# Patient Record
Sex: Male | Born: 1970 | Race: White | Hispanic: No | Marital: Married | Smoking: Never smoker
Health system: Southern US, Community
[De-identification: ages and names within clinical notes are randomized; demographics above are authoritative.]

## PROBLEM LIST (undated history)

## (undated) DIAGNOSIS — F32A Depression, unspecified: Secondary | ICD-10-CM

## (undated) DIAGNOSIS — E039 Hypothyroidism, unspecified: Secondary | ICD-10-CM

## (undated) DIAGNOSIS — R519 Headache, unspecified: Secondary | ICD-10-CM

## (undated) DIAGNOSIS — G4730 Sleep apnea, unspecified: Secondary | ICD-10-CM

## (undated) DIAGNOSIS — R51 Headache: Secondary | ICD-10-CM

## (undated) DIAGNOSIS — Z87442 Personal history of urinary calculi: Secondary | ICD-10-CM

## (undated) DIAGNOSIS — F419 Anxiety disorder, unspecified: Secondary | ICD-10-CM

## (undated) DIAGNOSIS — F329 Major depressive disorder, single episode, unspecified: Secondary | ICD-10-CM

## (undated) DIAGNOSIS — F909 Attention-deficit hyperactivity disorder, unspecified type: Secondary | ICD-10-CM

## (undated) HISTORY — PX: TONSILLECTOMY AND ADENOIDECTOMY: SHX28

## (undated) HISTORY — PX: TONSILLECTOMY: SUR1361

---

## 1998-08-18 ENCOUNTER — Encounter: Payer: Self-pay | Admitting: Otolaryngology

## 1998-08-18 ENCOUNTER — Ambulatory Visit (HOSPITAL_COMMUNITY): Admission: RE | Admit: 1998-08-18 | Discharge: 1998-08-18 | Payer: Self-pay | Admitting: Otolaryngology

## 2004-10-31 ENCOUNTER — Ambulatory Visit: Payer: Self-pay | Admitting: Family Medicine

## 2005-04-20 ENCOUNTER — Emergency Department: Payer: Self-pay | Admitting: Emergency Medicine

## 2006-06-01 DIAGNOSIS — F419 Anxiety disorder, unspecified: Secondary | ICD-10-CM | POA: Insufficient documentation

## 2007-08-09 ENCOUNTER — Ambulatory Visit: Payer: Self-pay | Admitting: Family Medicine

## 2008-07-31 DIAGNOSIS — J329 Chronic sinusitis, unspecified: Secondary | ICD-10-CM | POA: Insufficient documentation

## 2009-01-14 ENCOUNTER — Ambulatory Visit: Payer: Self-pay | Admitting: Family Medicine

## 2009-11-24 DIAGNOSIS — J309 Allergic rhinitis, unspecified: Secondary | ICD-10-CM | POA: Insufficient documentation

## 2010-11-10 ENCOUNTER — Ambulatory Visit: Payer: Self-pay

## 2011-02-24 ENCOUNTER — Emergency Department (INDEPENDENT_AMBULATORY_CARE_PROVIDER_SITE_OTHER): Payer: Managed Care, Other (non HMO)

## 2011-02-24 ENCOUNTER — Emergency Department (HOSPITAL_BASED_OUTPATIENT_CLINIC_OR_DEPARTMENT_OTHER)
Admission: EM | Admit: 2011-02-24 | Discharge: 2011-02-24 | Disposition: A | Discharge status: Home / Self Care | Payer: Managed Care, Other (non HMO) | Attending: Emergency Medicine | Admitting: Emergency Medicine

## 2011-02-24 DIAGNOSIS — N201 Calculus of ureter: Secondary | ICD-10-CM

## 2011-02-24 DIAGNOSIS — R109 Unspecified abdominal pain: Secondary | ICD-10-CM

## 2011-02-24 DIAGNOSIS — N200 Calculus of kidney: Secondary | ICD-10-CM | POA: Insufficient documentation

## 2011-02-24 LAB — URINALYSIS, ROUTINE W REFLEX MICROSCOPIC
Bilirubin Urine: NEGATIVE
Glucose, UA: NEGATIVE mg/dL
Ketones, ur: NEGATIVE mg/dL
Leukocytes, UA: NEGATIVE
Nitrite: NEGATIVE
Protein, ur: NEGATIVE mg/dL
Specific Gravity, Urine: 1.02 (ref 1.005–1.030)
Urobilinogen, UA: 0.2 mg/dL (ref 0.0–1.0)
pH: 8 (ref 5.0–8.0)

## 2011-02-24 LAB — BASIC METABOLIC PANEL
BUN: 13 mg/dL (ref 6–23)
CO2: 25 mEq/L (ref 19–32)
Calcium: 9.5 mg/dL (ref 8.4–10.5)
Chloride: 104 mEq/L (ref 96–112)
Creatinine, Ser: 1.1 mg/dL (ref 0.50–1.35)
GFR calc Af Amer: >60 mL/min (ref >60)
GFR calc non Af Amer: >60 mL/min (ref >60)
Glucose, Bld: 142 mg/dL — ABNORMAL HIGH (ref 70–99)
Potassium: 3.4 mEq/L — ABNORMAL LOW (ref 3.5–5.1)
Sodium: 139 mEq/L (ref 135–145)

## 2011-02-24 LAB — URINE MICROSCOPIC-ADD ON

## 2013-10-22 ENCOUNTER — Emergency Department: Payer: Self-pay | Admitting: Emergency Medicine

## 2013-10-27 ENCOUNTER — Other Ambulatory Visit: Payer: Self-pay | Admitting: Family Medicine

## 2013-10-27 DIAGNOSIS — R51 Headache: Secondary | ICD-10-CM

## 2013-10-28 ENCOUNTER — Ambulatory Visit
Admission: RE | Admit: 2013-10-28 | Discharge: 2013-10-28 | Disposition: A | Discharge status: Home / Self Care | Payer: BC Managed Care – PPO | Source: Ambulatory Visit | Attending: Family Medicine | Admitting: Family Medicine

## 2013-10-28 DIAGNOSIS — R51 Headache: Secondary | ICD-10-CM

## 2014-06-29 IMAGING — CT CT HEAD W/O CM
2 series · 16 of 30 positions shown, 20 images · non-contrast
Comparison: None.

CLINICAL DATA: Migraine headache.

EXAM:
CT HEAD WITHOUT CONTRAST
TECHNIQUE: Contiguous axial images were obtained from the base of the skull
through the vertex without intravenous contrast.

[Series 3: head bone · axial · 0.49mm/px · z∈[+23,+69]mm · 3 of 32 slices shown]
[im 3/32  bone]
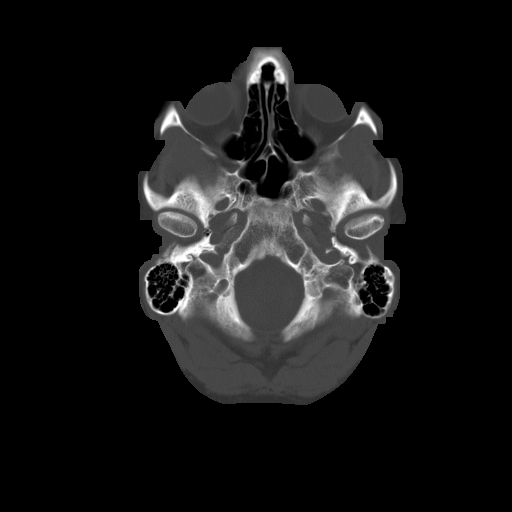
[im 7/32  bone]
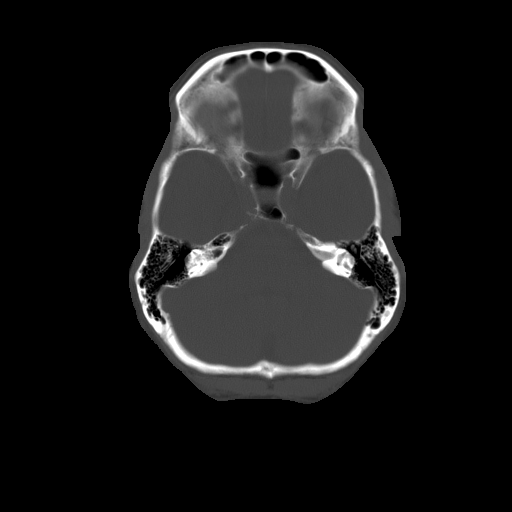
[im 12/32  bone]
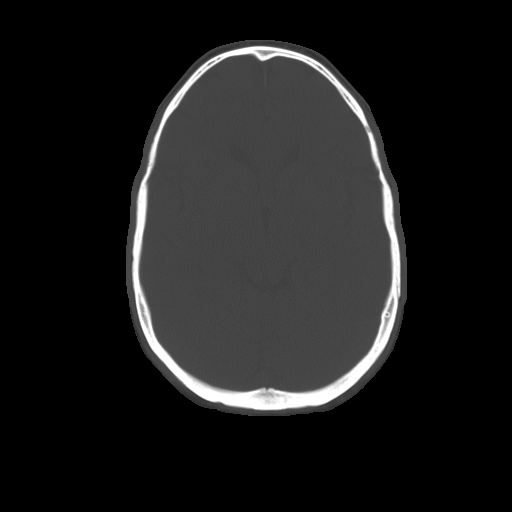

[Series 32: 3d filtered head w/o · axial · non-contrast · 0.49mm/px · z∈[+23,+156]mm · 13 of 32 slices shown, 17 images]
[im 3/32  brain]
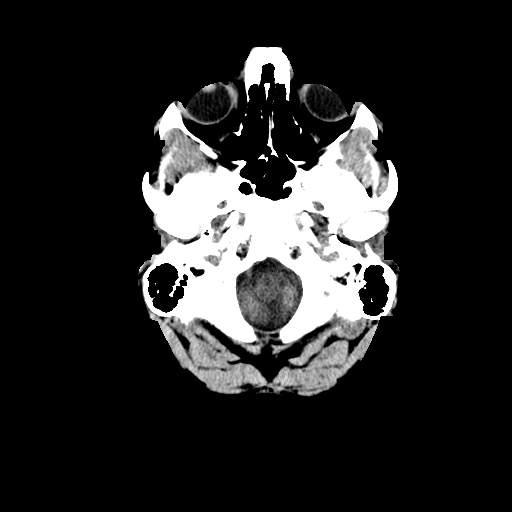
[im 3/32  bone]
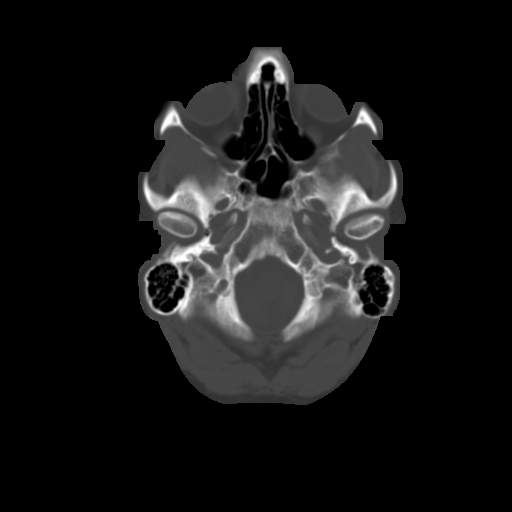
[im 5/32  brain]
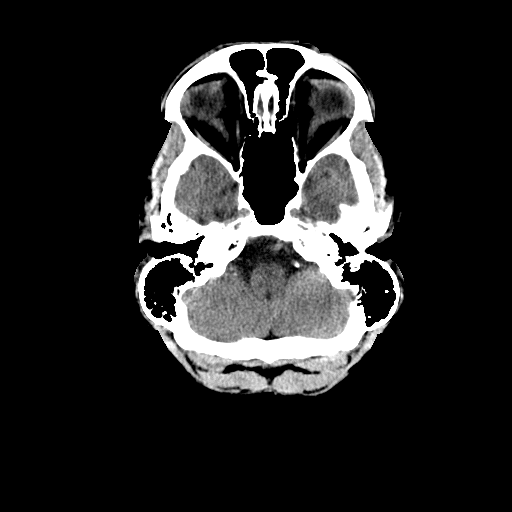
[im 7/32  brain]
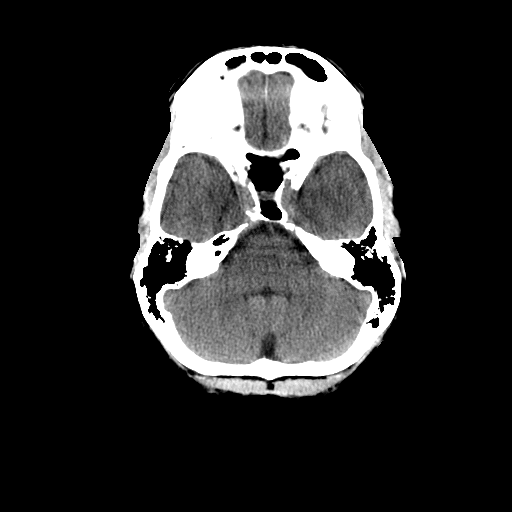
[im 9/32  brain]
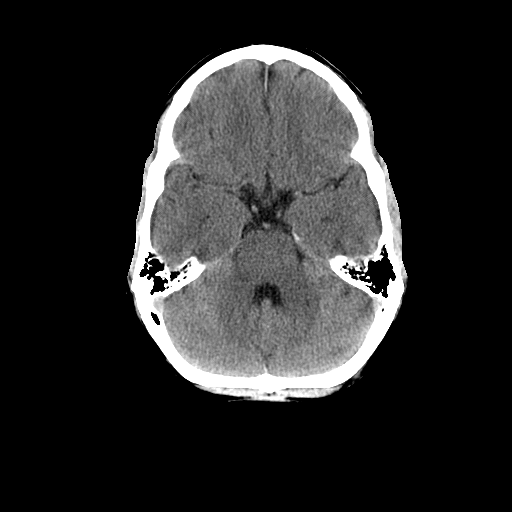
[im 12/32  brain]
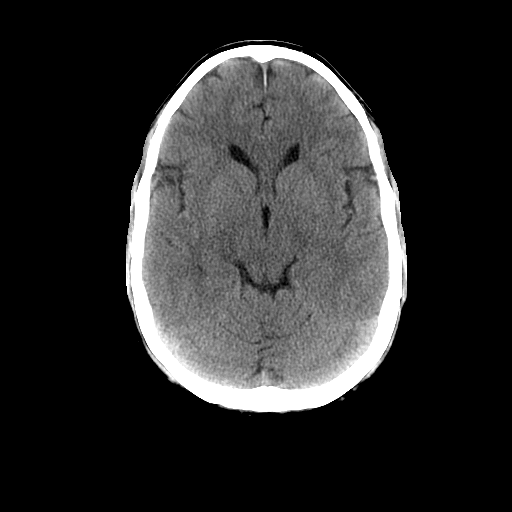
[im 12/32  bone]
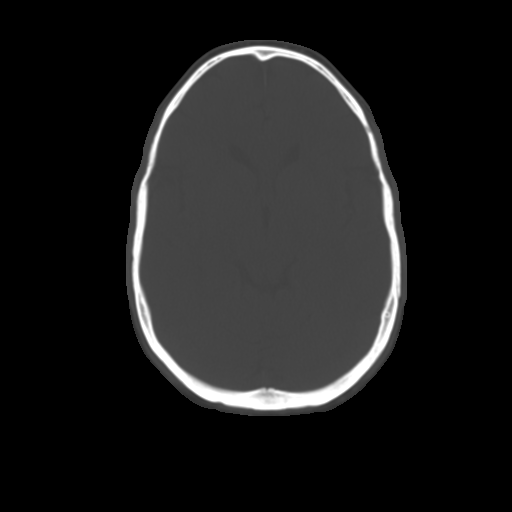
[im 14/32  brain]
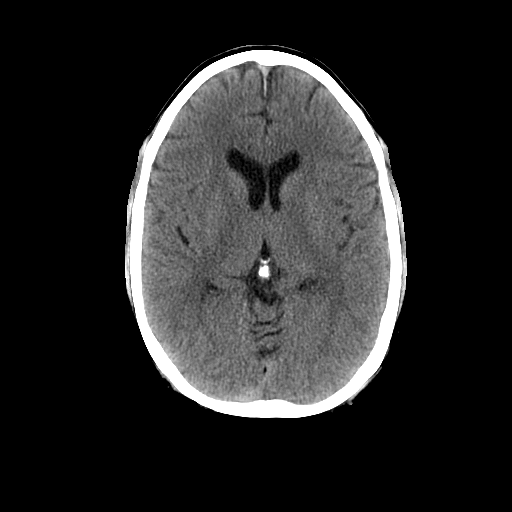
[im 16/32  brain]
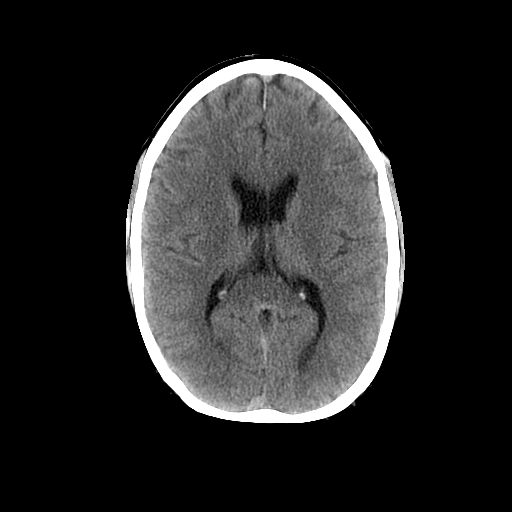
[im 18/32  brain]
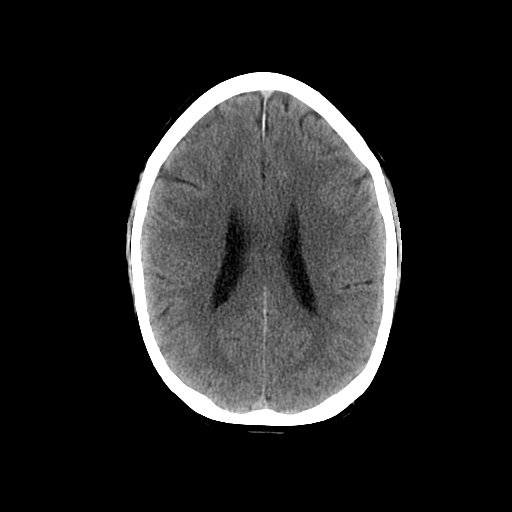
[im 20/32  brain]
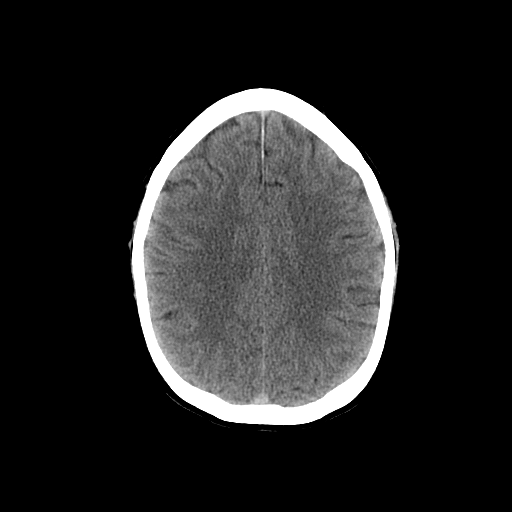
[im 20/32  bone]
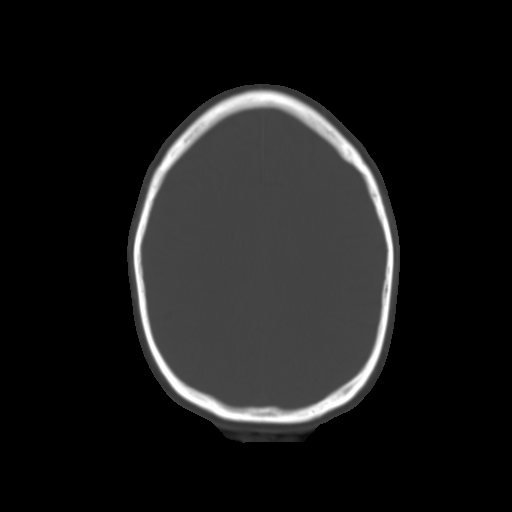
[im 23/32  brain]
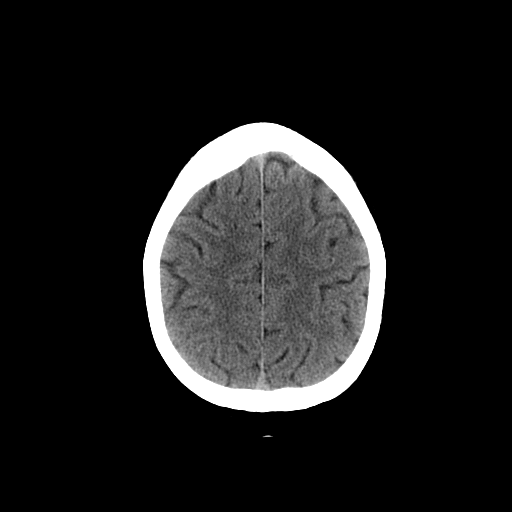
[im 25/32  brain]
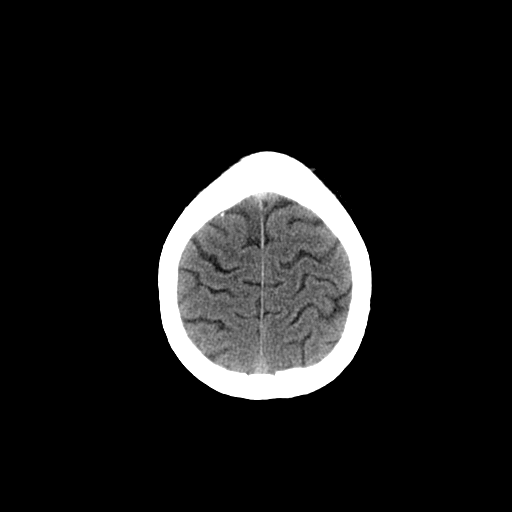
[im 27/32  brain]
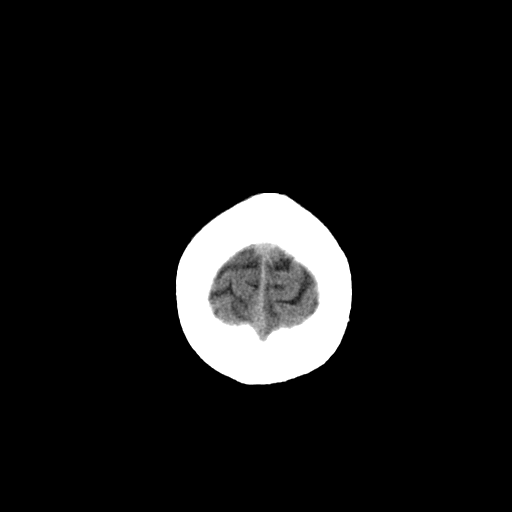
[im 29/32  brain]
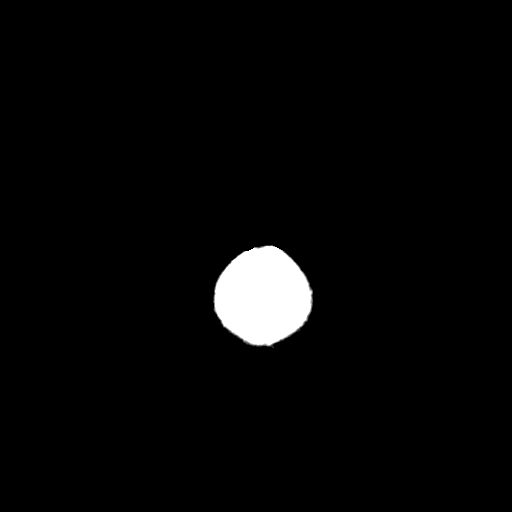
[im 29/32  bone]
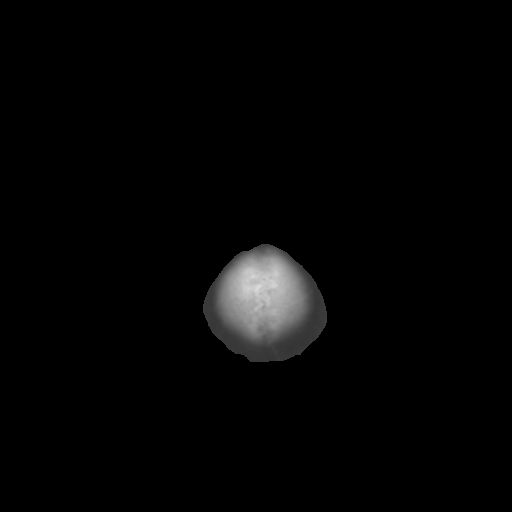

[16 of 30 positions shown; findings below may reference images not displayed]

FINDINGS: No mass lesion, mass effect, midline shift, hydrocephalus,
hemorrhage. No territorial ischemia or acute infarction. The
paranasal sinuses appear within normal limits.
IMPRESSION: Negative CT head.

## 2014-11-10 ENCOUNTER — Emergency Department (HOSPITAL_COMMUNITY): Payer: BLUE CROSS/BLUE SHIELD

## 2014-11-10 ENCOUNTER — Encounter (HOSPITAL_COMMUNITY): Payer: Self-pay

## 2014-11-10 ENCOUNTER — Emergency Department (HOSPITAL_COMMUNITY)
Admission: EM | Admit: 2014-11-10 | Discharge: 2014-11-10 | Disposition: A | Discharge status: Home / Self Care | Payer: BLUE CROSS/BLUE SHIELD | Attending: Emergency Medicine | Admitting: Emergency Medicine

## 2014-11-10 DIAGNOSIS — R42 Dizziness and giddiness: Secondary | ICD-10-CM | POA: Diagnosis not present

## 2014-11-10 DIAGNOSIS — R61 Generalized hyperhidrosis: Secondary | ICD-10-CM | POA: Diagnosis not present

## 2014-11-10 DIAGNOSIS — R002 Palpitations: Secondary | ICD-10-CM | POA: Diagnosis present

## 2014-11-10 DIAGNOSIS — R110 Nausea: Secondary | ICD-10-CM | POA: Insufficient documentation

## 2014-11-10 DIAGNOSIS — R55 Syncope and collapse: Secondary | ICD-10-CM | POA: Diagnosis not present

## 2014-11-10 HISTORY — DX: Anxiety disorder, unspecified: F41.9

## 2014-11-10 HISTORY — DX: Attention-deficit hyperactivity disorder, unspecified type: F90.9

## 2014-11-10 HISTORY — DX: Major depressive disorder, single episode, unspecified: F32.9

## 2014-11-10 HISTORY — DX: Depression, unspecified: F32.A

## 2014-11-10 LAB — I-STAT TROPONIN, ED: TROPONIN I, POC: 0 ng/mL (ref 0.00–0.08)

## 2014-11-10 LAB — CBC WITH DIFFERENTIAL/PLATELET
BASOS ABS: 0 10*3/uL (ref 0.0–0.1)
Basophils Relative: 0 % (ref 0–1)
EOS PCT: 0 % (ref 0–5)
Eosinophils Absolute: 0 10*3/uL (ref 0.0–0.7)
HCT: 42 % (ref 39.0–52.0)
Hemoglobin: 14.6 g/dL (ref 13.0–17.0)
LYMPHS PCT: 23 % (ref 12–46)
Lymphs Abs: 1.4 10*3/uL (ref 0.7–4.0)
MCH: 31.7 pg (ref 26.0–34.0)
MCHC: 34.8 g/dL (ref 30.0–36.0)
MCV: 91.3 fL (ref 78.0–100.0)
Monocytes Absolute: 0.4 10*3/uL (ref 0.1–1.0)
Monocytes Relative: 6 % (ref 3–12)
NEUTROS PCT: 71 % (ref 43–77)
Neutro Abs: 4.3 10*3/uL (ref 1.7–7.7)
PLATELETS: 225 10*3/uL (ref 150–400)
RBC: 4.6 MIL/uL (ref 4.22–5.81)
RDW: 11.9 % (ref 11.5–15.5)
WBC: 6.1 10*3/uL (ref 4.0–10.5)

## 2014-11-10 LAB — BASIC METABOLIC PANEL
Anion gap: 10 (ref 5–15)
BUN: 13 mg/dL (ref 6–23)
CO2: 28 mmol/L (ref 19–32)
Calcium: 9.8 mg/dL (ref 8.4–10.5)
Chloride: 102 mmol/L (ref 96–112)
Creatinine, Ser: 1.22 mg/dL (ref 0.50–1.35)
GFR calc Af Amer: 81 mL/min — ABNORMAL LOW (ref >90)
GFR calc non Af Amer: 70 mL/min — ABNORMAL LOW (ref >90)
GLUCOSE: 106 mg/dL — AB (ref 70–99)
POTASSIUM: 4 mmol/L (ref 3.5–5.1)
Sodium: 140 mmol/L (ref 135–145)

## 2014-11-10 LAB — MAGNESIUM: Magnesium: 2.3 mg/dL (ref 1.5–2.5)

## 2014-11-10 LAB — CBG MONITORING, ED: GLUCOSE-CAPILLARY: 104 mg/dL — AB (ref 70–99)

## 2014-11-10 NOTE — ED Notes (Signed)
Per [facility], pt from work for palpitations. Was sitting in a meeting and started feeling like his heart was racing. Denies having any chest pain. Pt states he felt lightheaded and clammy and nauseated. Denies any present. HR was going between 90-120 with EMS. 20g to LAC.

## 2014-11-10 NOTE — ED Notes (Signed)
CBG of 104 checked

## 2014-11-10 NOTE — ED Provider Notes (Signed)
CSN: [id number]     Arrival date & time 11/10/14  1606 History   First MD Initiated Contact with Patient 11/10/14 1611     Chief Complaint  Patient presents with  . Palpitations     (Consider location/radiation/quality/duration/timing/severity/associated sxs/prior Treatment) HPI  44 year old male with no significant past medical history. Comes in with a complaint of presyncope.  The patient says he was at work, in a meeting, when he started feeling kind of clammy and nauseous, hot, lightheaded. This went on for a period of 20 minutes, after which he came back to his baseline. He says it with the EMS was called and with EMS he was hypertensive above his baseline of 120/80.  Patient denies any chest pain/shortness of breath, neck or arm pain.   No past medical history on file. No past surgical history on file. No family history on file. History  Substance Use Topics  . Smoking status: Not on file  . Smokeless tobacco: Not on file  . Alcohol Use: Not on file    Review of Systems  Constitutional: Positive for diaphoresis. Negative for fever and chills.  Eyes: Negative for redness.  Respiratory: Negative for cough and shortness of breath.   Cardiovascular: Negative for chest pain.  Gastrointestinal: Positive for nausea. Negative for vomiting, abdominal pain and diarrhea.  Genitourinary: Negative for dysuria.  Skin: Negative for rash.  Neurological: Positive for light-headedness. Negative for headaches.  All other systems reviewed and are negative.     Allergies  Review of patient's allergies indicates not on file.  Home Medications   Prior to Admission medications   Not on File   There were no vitals taken for this visit. Physical Exam  Constitutional: He is oriented to person, place, and time. No distress.  HENT:  Head: Normocephalic and atraumatic.  Eyes: EOM are normal. Pupils are equal, round, and reactive to light.  Neck: Normal range of motion. Neck supple.   Cardiovascular: Normal rate.   No murmur heard. Pulmonary/Chest: Effort normal. No respiratory distress.  Abdominal: Soft. There is no tenderness.  Musculoskeletal: Normal range of motion.  Neurological: He is alert and oriented to person, place, and time.  Skin: No rash noted. He is not diaphoretic.  Psychiatric: He has a normal mood and affect.   Cranial Nerves  II:  Visual fields Intact, pupillary equal round and reactive to light III, IV, VI: Full eye movement without nystagmus  V Facial Sensation: Normal. No weakness of masticatory muscles  VII: No facial weakness or asymmetry  VIII Auditory Acuity: Grossly normal  IX/X: The uvula is midline; the palate elevates symmetrically  XI: Normal sternocleidomastoid and trapezius strength  XII: The tongue is midline. No atrophy or fasciculations.  Motor System: Muscle Strength: 5/5 and symmetric in the upper and lower extremities. No pronation or drift.  Muscle Tone: Tone and muscle bulk are normal in the upper and lower extremities.  Reflexes: DTRs: 2+ and symmetrical in all four extremities.  Coordination: Intact finger-to-nose, heel-to-shin, and rapid alternating movements. No tremor.  Sensation: Intact to light touch. Negative Romberg test.  Gait: Routine and tandem gait are normal    ED Course  Procedures (including critical care time) Labs Review Labs Reviewed  CBC WITH DIFFERENTIAL/PLATELET  BASIC METABOLIC PANEL  MAGNESIUM  CBG MONITORING, ED    Imaging Review No results found.   EKG Interpretation None      MDM   Final diagnoses:  None    44 year old male with no significant past  medical history. Comes in with a complaint of presyncope.  Exam as above, well appearing, normal vitals.  EKG w/o long QT, WPW, brugada. Cbc/bmp unremarkable.  Given no hx of cardiac disease, no true syncope and prodromal sx.  Feel likely vasovagal pre-syncope.  Feel safe for d/c w/ outpatient f/u.  I have discussed the  results, Dx and Tx plan with the patient. They expressed understanding and agree with the plan and were told to return to ED with any worsening of condition or concern.    Disposition: Discharge  Condition: Good  There are no discharge medications for this patient.   Follow Up: [facility] EMERGENCY DEPARTMENT [address] [id number] mc [place] [address] [phone]  If symptoms worsen   Pt seen in conjunction with Dr. Christ Kick, MD 11/11/14 0020  Mirian Mo, MD 11/16/14 [phone]

## 2014-11-10 NOTE — Discharge Instructions (Signed)
Near-Syncope Near-syncope (commonly known as near fainting) is sudden weakness, dizziness, or feeling like you might pass out. During an episode of near-syncope, you may also develop pale skin, have tunnel vision, or feel sick to your stomach (nauseous). Near-syncope may occur when getting up after sitting or while standing for a long time. It is caused by a sudden decrease in blood flow to the brain. This decrease can result from various causes or triggers, most of which are not serious. However, because near-syncope can sometimes be a sign of something serious, a medical evaluation is required. The specific cause is often not determined. HOME CARE INSTRUCTIONS  Monitor your condition for any changes. The following actions may help to alleviate any discomfort you are experiencing:  Have someone stay with you until you feel stable.  Lie down right away and prop your feet up if you start feeling like you might faint. Breathe deeply and steadily. Wait until all the symptoms have passed. Most of these episodes last only a few minutes. You may feel tired for several hours.   Drink enough fluids to keep your urine clear or pale yellow.   If you are taking blood pressure or heart medicine, get up slowly when seated or lying down. Take several minutes to sit and then stand. This can reduce dizziness.  Follow up with your health care provider as directed. SEEK IMMEDIATE MEDICAL CARE IF:   You have a severe headache.   You have unusual pain in the chest, abdomen, or back.   You are bleeding from the mouth or rectum, or you have black or tarry stool.   You have an irregular or very fast heartbeat.   You have repeated fainting or have seizure-like jerking during an episode.   You faint when sitting or lying down.   You have confusion.   You have difficulty walking.   You have severe weakness.   You have vision problems.  MAKE SURE YOU:   Understand these instructions.  Will  watch your condition.  Will get help right away if you are not doing well or get worse. Document Released: 08/14/2005 Document Revised: 08/19/2013 Document Reviewed: 01/17/2013 ExitCare Patient Information 2015 [employer]. This information is not intended to replace advice given to you by your health care provider. Make sure you discuss any questions you have with your health care provider.  

## 2014-11-10 NOTE — ED Notes (Signed)
Resident at the bedside

## 2014-11-16 LAB — LIPID PANEL
Cholesterol: 233 mg/dL — AB (ref 0–200)
HDL: 46 mg/dL (ref 35–70)
LDL Cholesterol: 129 mg/dL
Triglycerides: 288 mg/dL — AB (ref 40–160)

## 2014-11-19 DIAGNOSIS — R519 Headache, unspecified: Secondary | ICD-10-CM | POA: Insufficient documentation

## 2014-11-19 DIAGNOSIS — M25519 Pain in unspecified shoulder: Secondary | ICD-10-CM | POA: Insufficient documentation

## 2014-11-19 DIAGNOSIS — R6889 Other general symptoms and signs: Secondary | ICD-10-CM | POA: Insufficient documentation

## 2014-11-19 DIAGNOSIS — R002 Palpitations: Secondary | ICD-10-CM | POA: Insufficient documentation

## 2014-11-19 DIAGNOSIS — R069 Unspecified abnormalities of breathing: Secondary | ICD-10-CM | POA: Insufficient documentation

## 2014-11-19 DIAGNOSIS — F329 Major depressive disorder, single episode, unspecified: Secondary | ICD-10-CM | POA: Insufficient documentation

## 2014-11-19 DIAGNOSIS — G43109 Migraine with aura, not intractable, without status migrainosus: Secondary | ICD-10-CM | POA: Insufficient documentation

## 2014-11-19 DIAGNOSIS — R51 Headache: Secondary | ICD-10-CM

## 2015-02-02 ENCOUNTER — Ambulatory Visit (INDEPENDENT_AMBULATORY_CARE_PROVIDER_SITE_OTHER): Payer: BLUE CROSS/BLUE SHIELD | Admitting: Family Medicine

## 2015-02-02 ENCOUNTER — Encounter: Payer: Self-pay | Admitting: Family Medicine

## 2015-02-02 VITALS — BP 104/78 | HR 68 | Temp 98.0°F | Resp 16 | Ht 68.0 in | Wt 188.0 lb

## 2015-02-02 DIAGNOSIS — Z0289 Encounter for other administrative examinations: Secondary | ICD-10-CM

## 2015-02-02 NOTE — Progress Notes (Signed)
Patient ID: Caleb Miles, male   DOB: 07-25-1971, 44 y.o.   MRN: [id number] Chief Complaint  Patient presents with  . Letter for School/Work    Patient comes in office today top discuss FMLA leave, he states that he has missed a lot of time from work due to personal related reasons ( migranes, stress/anxiety) and needs forms filled out for work.  Has underwent negative cardiac w/u for palpitations and continues to see psychiatry (Dr. Evelene CroonKaur) for Depression, anxiety, and ADD. Treat migraine headaches with tylenol and trazodone to help sleep. Estimates 20/year which usually will knock him out of work for 1.5 days. Wishes FMLA to cover for these periodic absences. FMLA completed

## 2015-02-02 NOTE — Patient Instructions (Signed)
Will amend FMLA as necessary

## 2015-06-28 ENCOUNTER — Ambulatory Visit: Payer: BLUE CROSS/BLUE SHIELD | Admitting: Family Medicine

## 2015-06-28 DIAGNOSIS — Z87898 Personal history of other specified conditions: Secondary | ICD-10-CM | POA: Insufficient documentation

## 2015-06-28 DIAGNOSIS — M25519 Pain in unspecified shoulder: Secondary | ICD-10-CM | POA: Insufficient documentation

## 2015-06-28 DIAGNOSIS — G43109 Migraine with aura, not intractable, without status migrainosus: Secondary | ICD-10-CM | POA: Insufficient documentation

## 2015-06-28 DIAGNOSIS — R0681 Apnea, not elsewhere classified: Secondary | ICD-10-CM | POA: Insufficient documentation

## 2015-06-28 DIAGNOSIS — R0602 Shortness of breath: Secondary | ICD-10-CM | POA: Insufficient documentation

## 2015-07-12 IMAGING — CR DG CHEST 2V
2 series · 2 of 2 positions shown · non-contrast
Comparison: None.

CLINICAL DATA: The patient was sitting in a meeting this morning
and started feeling lightheaded and dizzy. Numbness/ tingling left
arm. No recent chest complaints.

EXAM:
CHEST  2 VIEW

[chest pa]
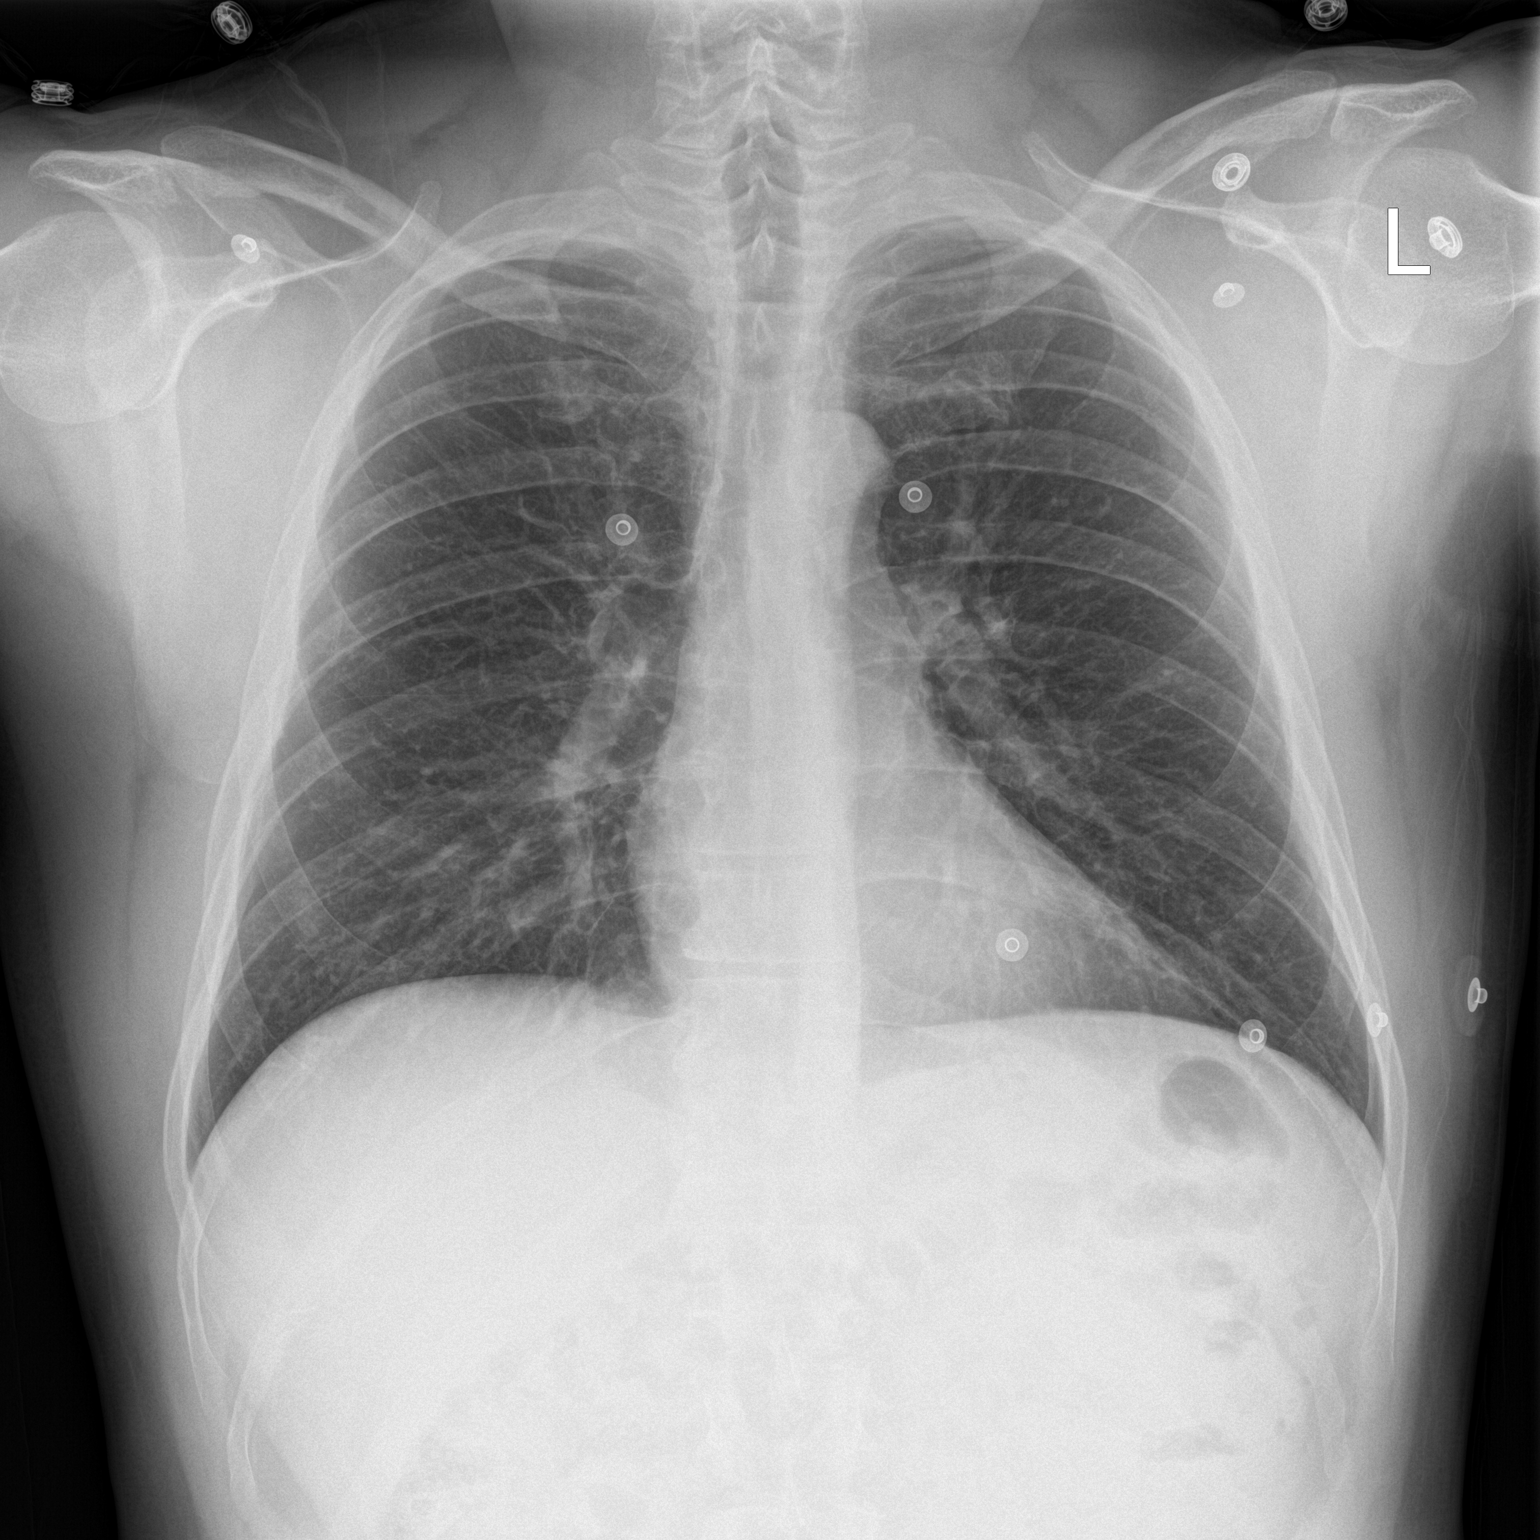

[chest lat]
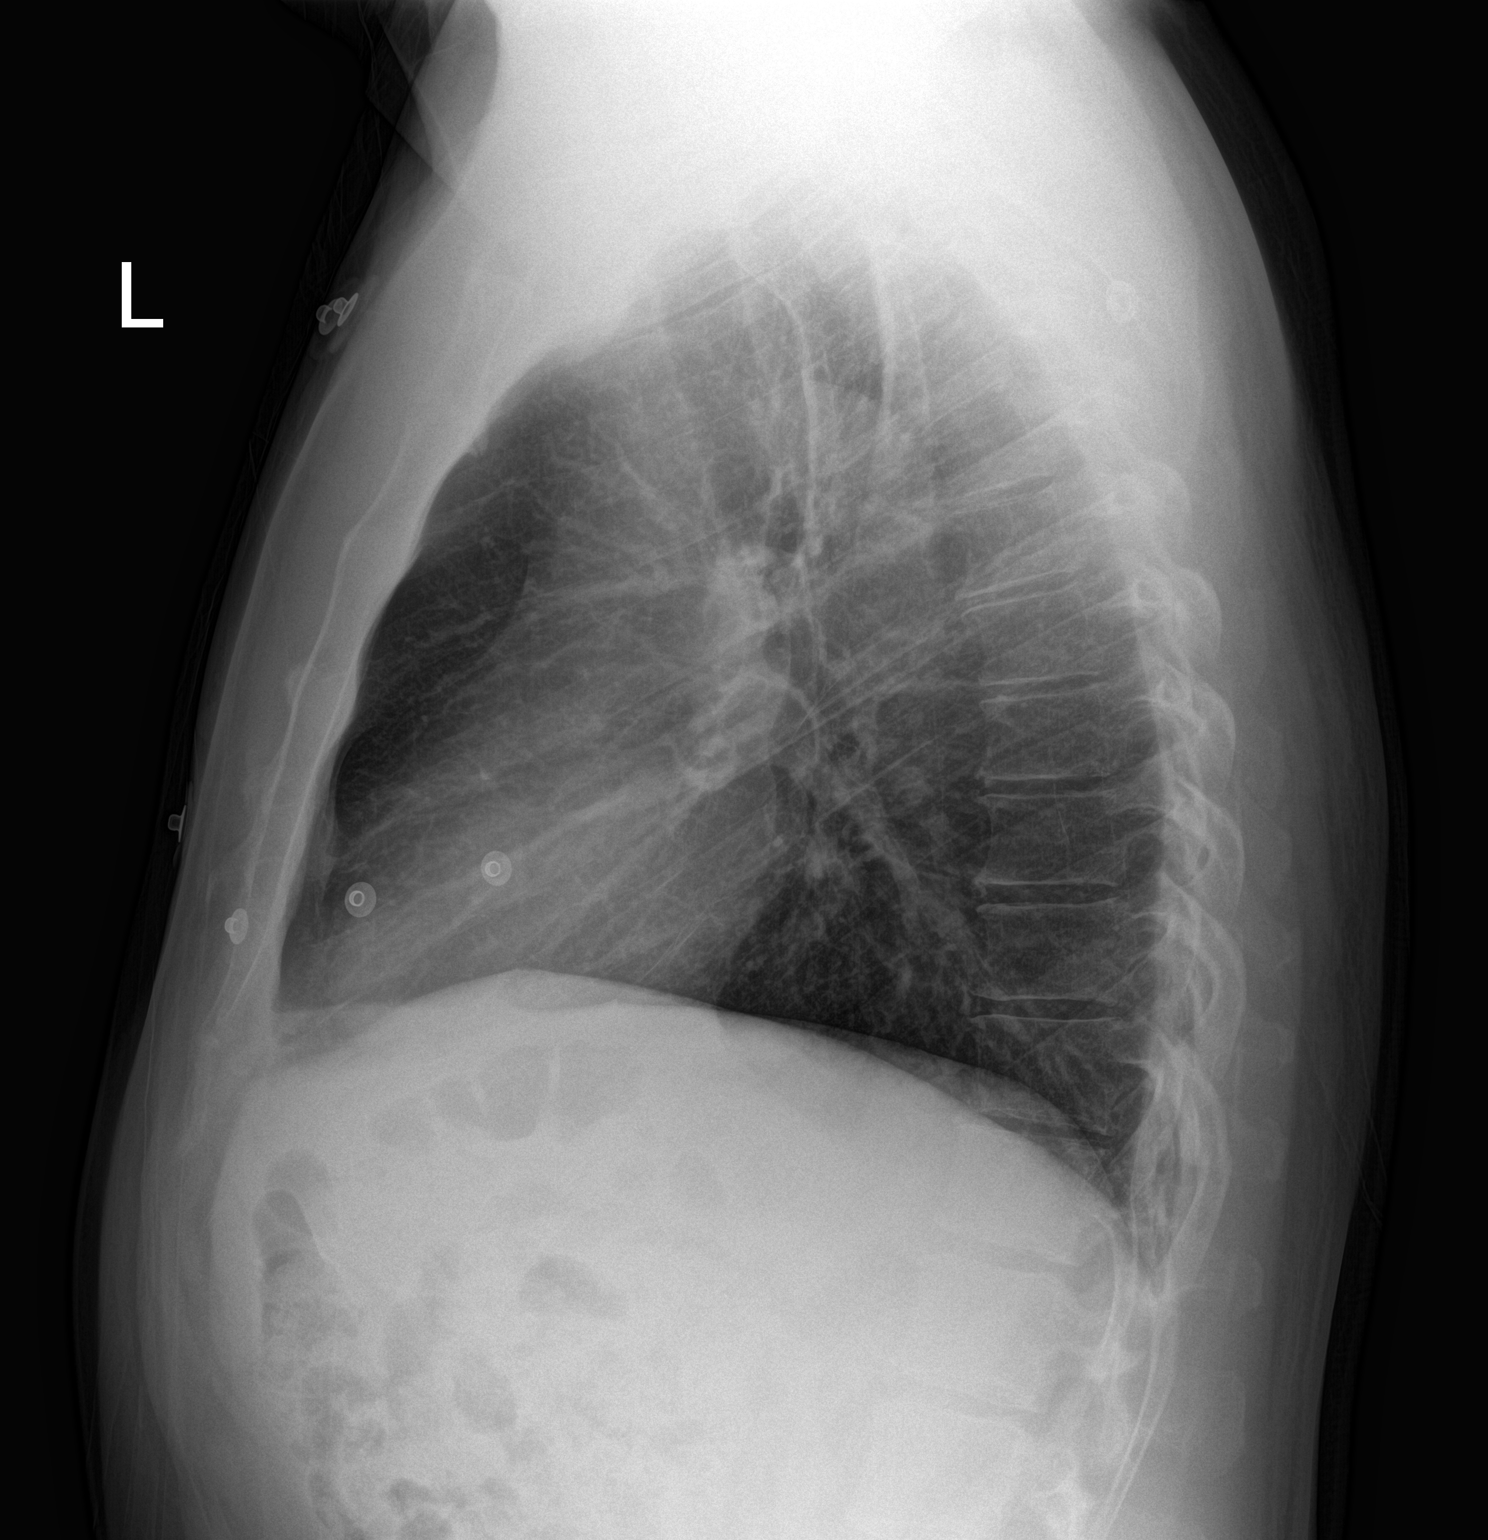

[2 of 2 positions shown; findings below may reference images not displayed]

FINDINGS: The heart size and mediastinal contours are within normal limits.
Both lungs are clear. The visualized skeletal structures are
unremarkable.
IMPRESSION: No active cardiopulmonary disease.

## 2015-09-20 ENCOUNTER — Encounter: Payer: Self-pay | Admitting: Family Medicine

## 2015-09-20 ENCOUNTER — Ambulatory Visit (INDEPENDENT_AMBULATORY_CARE_PROVIDER_SITE_OTHER): Payer: BLUE CROSS/BLUE SHIELD | Admitting: Family Medicine

## 2015-09-20 VITALS — BP 110/78 | HR 64 | Temp 97.8°F | Resp 16 | Wt 190.2 lb

## 2015-09-20 DIAGNOSIS — J069 Acute upper respiratory infection, unspecified: Secondary | ICD-10-CM

## 2015-09-20 DIAGNOSIS — F988 Other specified behavioral and emotional disorders with onset usually occurring in childhood and adolescence: Secondary | ICD-10-CM | POA: Insufficient documentation

## 2015-09-20 MED ORDER — AMOXICILLIN-POT CLAVULANATE 875-125 MG PO TABS: 1.0000 | ORAL_TABLET | Freq: Two times a day (BID) | ORAL | Status: DC

## 2015-09-20 NOTE — Progress Notes (Signed)
Subjective:     Patient ID: Caleb Miles, male   DOB: Aug 12, 1971, 45 y.o.   MRN: [id number]  HPI  Chief Complaint  Patient presents with  . Sinus Problem    Patient comes in office today with concerns of sinus pain and congestion for the past week. Patient states that pressure is behind his eyes and around his nasal area, he reports pressure in ears but denies any other associated symptoms. Patient has taken otc MUcinex D and Tylenol for relief  States this started with a sore throat then development of purulent sinus drainage. Notes improvement with Mucinex D.   Review of Systems  Constitutional: Negative for fever and chills.       Objective:   Physical Exam  Constitutional: He appears well-developed and well-nourished. No distress.  Ears: T.M's intact without inflammation Sinuses: mild maxillary sinus tenderness Throat: tonsils absent Neck: no cervical adenopathy Lungs: clear     Assessment:    1. Upper respiratory infectio - amoxicillin-clavulanate (AUGMENTIN) 875-125 MG tablet; Take 1 tablet by mouth 2 (two) times daily.  Dispense: 20 tablet; Refill: 0    Plan:    Continue Mucinex D. Start antibiotic if sinuses not continuing to improve over the next 2-3 days.

## 2015-09-20 NOTE — Patient Instructions (Addendum)
Continue Mucinex D and Delsym for cough. Start antibiotic if sinuses not continuing to clear in the next 2-3 days.

## 2015-12-01 ENCOUNTER — Encounter: Payer: Self-pay | Admitting: Family Medicine

## 2015-12-01 ENCOUNTER — Ambulatory Visit (INDEPENDENT_AMBULATORY_CARE_PROVIDER_SITE_OTHER): Payer: BLUE CROSS/BLUE SHIELD | Admitting: Family Medicine

## 2015-12-01 VITALS — BP 104/80 | HR 72 | Temp 99.0°F | Resp 16 | Wt 197.2 lb

## 2015-12-01 DIAGNOSIS — L259 Unspecified contact dermatitis, unspecified cause: Secondary | ICD-10-CM | POA: Diagnosis not present

## 2015-12-01 MED ORDER — FLUOCINONIDE 0.05 % EX CREA: 1.0000 "application " | TOPICAL_CREAM | Freq: Three times a day (TID) | CUTANEOUS | Status: DC

## 2015-12-01 MED ORDER — PREDNISONE 20 MG PO TABS: ORAL_TABLET | ORAL | Status: DC

## 2015-12-01 NOTE — Progress Notes (Signed)
Subjective:     Patient ID: Caleb Miles, male   DOB: 01-Feb-1971, 45 y.o.   MRN: [id number]  HPI  Chief Complaint  Patient presents with  . Rash    Patient comes in office today with concerns of rash on both arms that presented itself on 11/28/15. Patient recalls doing yardwork and pulling up vines, he has applied Calomine lotion to areas on his arm.   Has been taking Claritin for allergies.   Review of Systems     Objective:   Physical Exam  Constitutional: He appears well-developed and well-nourished. No distress.  Papular/vesicular rash on his forearms in both a discrete/linear pattern.      Assessment:    1. Contact dermatitis - fluocinonide cream (LIDEX) 0.05 %; Apply 1 application topically 3 (three) times daily. To poison ivy rash  Dispense: 30 g; Refill: 0 - predniSONE (DELTASONE) 20 MG tablet; Taper as follows: 3 pills for 4 days, two pills for 4 days, one pill for four days  Dispense: 24 tablet; Refill: 0    Plan:    Will start prednisone if not improving with topical steroids or develops facial involvement.

## 2015-12-01 NOTE — Patient Instructions (Signed)
Discussed use of oral Benadryl for itching at night.

## 2016-05-02 ENCOUNTER — Ambulatory Visit (INDEPENDENT_AMBULATORY_CARE_PROVIDER_SITE_OTHER): Payer: BLUE CROSS/BLUE SHIELD | Admitting: Family Medicine

## 2016-05-02 ENCOUNTER — Encounter: Payer: Self-pay | Admitting: Family Medicine

## 2016-05-02 VITALS — BP 110/80 | HR 84 | Temp 98.4°F | Resp 15 | Wt 195.4 lb

## 2016-05-02 DIAGNOSIS — J0100 Acute maxillary sinusitis, unspecified: Secondary | ICD-10-CM

## 2016-05-02 MED ORDER — AMOXICILLIN-POT CLAVULANATE 875-125 MG PO TABS: 1.0000 | ORAL_TABLET | Freq: Two times a day (BID) | ORAL | 0 refills | Status: DC

## 2016-05-02 MED ORDER — HYDROCODONE-HOMATROPINE 5-1.5 MG/5ML PO SYRP: ORAL_SOLUTION | ORAL | 0 refills | Status: DC

## 2016-05-02 NOTE — Progress Notes (Signed)
Subjective:     Patient ID: Caleb Miles, male   DOB: 1971/06/08, [age] y.o.   MRN: [id number]  HPI  Chief Complaint  Patient presents with  . Cough    Patient comes into office today with concerns of cough and congestion. Patient staets that he has been ill for over a week with cough productive of yellow like phlegm, sinus pressure and post nasal drip. Patient states that he has taken otc Tylenol and Mucinex D.   Patient reports increased sinus pressure, purulent sinus drainage, post nasal drainage and accompanying cough   Review of Systems     Objective:   Physical Exam  Constitutional: He appears well-developed and well-nourished. No distress.  Ears: T.M's intact without inflammation Sinuses: moderate maxillary sinus tenderness Throat: tonsils absent Neck: no cervical adenopathy Lungs: clear     Assessment:    1. Acute maxillary sinusitis, recurrence not specified - amoxicillin-clavulanate (AUGMENTIN) 875-125 MG tablet; Take 1 tablet by mouth 2 (two) times daily.  Dispense: 20 tablet; Refill: 0 - HYDROcodone-homatropine (HYCODAN) 5-1.5 MG/5ML syrup; 5 ml 4-6 hours as needed for cough  Dispense: 240 mL; Refill: 0    Plan:    Continue Mucinex D

## 2016-05-02 NOTE — Patient Instructions (Signed)
Continue Mucinex D. 

## 2016-10-06 DIAGNOSIS — F331 Major depressive disorder, recurrent, moderate: Secondary | ICD-10-CM | POA: Diagnosis not present

## 2016-10-06 DIAGNOSIS — G4709 Other insomnia: Secondary | ICD-10-CM | POA: Diagnosis not present

## 2016-10-16 ENCOUNTER — Encounter: Payer: Self-pay | Admitting: Family Medicine

## 2016-10-16 ENCOUNTER — Ambulatory Visit (INDEPENDENT_AMBULATORY_CARE_PROVIDER_SITE_OTHER): Payer: BLUE CROSS/BLUE SHIELD | Admitting: Family Medicine

## 2016-10-16 VITALS — BP 112/80 | HR 76 | Temp 99.0°F | Resp 16 | Wt 191.4 lb

## 2016-10-16 DIAGNOSIS — J0100 Acute maxillary sinusitis, unspecified: Secondary | ICD-10-CM

## 2016-10-16 MED ORDER — AMOXICILLIN-POT CLAVULANATE 875-125 MG PO TABS: 1.0000 | ORAL_TABLET | Freq: Two times a day (BID) | ORAL | 0 refills | Status: DC

## 2016-10-16 NOTE — Patient Instructions (Signed)
Continue Mucinex D. Let me know if sinuses not improving.

## 2016-10-16 NOTE — Progress Notes (Signed)
Subjective:     Patient ID: Caleb Miles, male   DOB: July 17, 1971, 46 y.o.   MRN: [id number]  HPI  Chief Complaint  Patient presents with  . Sinus Problem    Patient comes in office today with complaints of sinus pain and pressure over his entire head for the past week and half. Associated with sinus pain patient complains of sore throat, ear pain and post nasal drip. Patient has been taking otc Mucinex D and Benadryl for relief.   Patient reports increased sinus pressure, purulent sinus drainage, post nasal drainage and accompanying cough   Review of Systems     Objective:   Physical Exam  Constitutional: He appears well-developed and well-nourished. No distress.  Ears: T.M's intact without inflammation Sinuses: mild maxillary sinus tenderness Throat: tonsils absent Neck: no cervical adenopathy Lungs: clear     Assessment:    1. Acute maxillary sinusitis, recurrence not specified - amoxicillin-clavulanate (AUGMENTIN) 875-125 MG tablet; Take 1 tablet by mouth 2 (two) times daily.  Dispense: 20 tablet; Refill: 0    Plan:    Continue Mucinex D. Work excuse for 2/15-2/16/18.

## 2016-12-01 DIAGNOSIS — F331 Major depressive disorder, recurrent, moderate: Secondary | ICD-10-CM | POA: Diagnosis not present

## 2016-12-12 ENCOUNTER — Encounter: Payer: Self-pay | Admitting: Family Medicine

## 2016-12-12 ENCOUNTER — Ambulatory Visit (INDEPENDENT_AMBULATORY_CARE_PROVIDER_SITE_OTHER): Payer: BLUE CROSS/BLUE SHIELD | Admitting: Family Medicine

## 2016-12-12 VITALS — BP 120/90 | HR 66 | Temp 98.2°F | Resp 16 | Wt 198.0 lb

## 2016-12-12 DIAGNOSIS — M722 Plantar fascial fibromatosis: Secondary | ICD-10-CM

## 2016-12-12 NOTE — Progress Notes (Signed)
Subjective:     Patient ID: Caleb Miles, male   DOB: 1971/02/15, 46 y.o.   MRN: [id number]  HPI  Chief Complaint  Patient presents with  . Foot Pain    Patient comes in office today with concerns of pain in his left heel for the past 2 months or more, patient states that pain has gradually been getting worse. Patient denies any injury to foot or prolonged standing/walking or exertion. Patient describes pain as sharp and achy, patient reports pain when walking or applying pressure to foot.   States he has put gel pads in his work boots and has taken ibuprofen with little improvement. Denies specific injury. 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   Review of Systems     Objective:   Physical Exam  Constitutional: He appears well-developed and well-nourished. No distress.  Cardiovascular:  Pulses:      Dorsalis pedis pulses are 2+ on the left side.       Posterior tibial pulses are 2+ on the left side.  Musculoskeletal: He exhibits no edema.  Left ankle DF/PF 5/5. Mild tenderness over plantar heel. Ankle ligaments stable       Assessment:    1. Plantar fasciitis of left foot - Ambulatory referral to Podiatry    Plan:    Continue current gel inserts pending podiatry referral.

## 2016-12-12 NOTE — Patient Instructions (Signed)
We will call you with the podiatry appointment time.

## 2016-12-22 ENCOUNTER — Encounter: Payer: Self-pay | Admitting: Podiatry

## 2016-12-22 ENCOUNTER — Ambulatory Visit (INDEPENDENT_AMBULATORY_CARE_PROVIDER_SITE_OTHER): Payer: BLUE CROSS/BLUE SHIELD

## 2016-12-22 ENCOUNTER — Ambulatory Visit (INDEPENDENT_AMBULATORY_CARE_PROVIDER_SITE_OTHER): Payer: BLUE CROSS/BLUE SHIELD | Admitting: Podiatry

## 2016-12-22 DIAGNOSIS — M722 Plantar fascial fibromatosis: Secondary | ICD-10-CM | POA: Diagnosis not present

## 2016-12-22 DIAGNOSIS — M79672 Pain in left foot: Secondary | ICD-10-CM

## 2016-12-22 MED ORDER — MELOXICAM 15 MG PO TABS: 15.0000 mg | ORAL_TABLET | Freq: Every day | ORAL | 3 refills | Status: DC

## 2016-12-24 MED ORDER — BETAMETHASONE SOD PHOS & ACET 6 (3-3) MG/ML IJ SUSP: 3.0000 mg | Freq: Once | INTRAMUSCULAR | Status: DC

## 2016-12-24 NOTE — Progress Notes (Signed)
   Subjective: Patient presents today for burning pain and tenderness in the left foot that has been ongoing for the past 6 months. Patient states that it hurts in the mornings with the first steps out of bed and walking increases the pain as well. Patient presents today for further treatment and evaluation  Objective: Physical Exam General: The patient is alert and oriented x3 in no acute distress.  Dermatology: Skin is warm, dry and supple bilateral lower extremities. Negative for open lesions or macerations bilateral.   Vascular: Dorsalis Pedis and Posterior Tibial pulses palpable bilateral.  Capillary fill time is immediate to all digits.  Neurological: Epicritic and protective threshold intact bilateral.   Musculoskeletal: Tenderness to palpation at the medial calcaneal tubercale and through the insertion of the plantar fascia of the left foot. All other joints range of motion within normal limits bilateral. Strength 5/5 in all groups bilateral.   Radiographic exam:   Normal osseous mineralization. Joint spaces preserved. No fracture/dislocation/boney destruction. Calcaneal spur present with mild thickening of plantar fascia left. No other soft tissue abnormalities or radiopaque foreign bodies.   Assessment: 1. Plantar fasciitis left foot 2. Pain in left foot  Plan of Care:   1. Patient evaluated. Xrays reviewed.   2. Injection of 0.5cc Celestone soluspan injected into the left plantar fascia.  3. Instructed patient regarding therapies and modalities at home to alleviate symptoms.  4. Rx for meloxicam  PO given to patient.  5. Recommended OTC insoles from [employer].  6. Plantar fascial band(s) dispensed. 7. Pt is considering orthotics, ask at next visit. 8. Return to clinic in 4 weeks.    Works as a Engineer, petroleum at [employer].   Felecia Shelling, DPM [facility]  Dr. Felecia Shelling, DPM    [address]                                          [place], [place] [address]                Office [phone]  Fax [phone]

## 2017-01-04 ENCOUNTER — Other Ambulatory Visit: Payer: Self-pay

## 2017-01-04 ENCOUNTER — Encounter: Payer: Self-pay | Admitting: Family Medicine

## 2017-01-04 ENCOUNTER — Ambulatory Visit (INDEPENDENT_AMBULATORY_CARE_PROVIDER_SITE_OTHER): Payer: BLUE CROSS/BLUE SHIELD | Admitting: Family Medicine

## 2017-01-04 VITALS — BP 132/86 | HR 98 | Temp 98.5°F | Resp 16 | Wt 199.8 lb

## 2017-01-04 DIAGNOSIS — J0100 Acute maxillary sinusitis, unspecified: Secondary | ICD-10-CM

## 2017-01-04 DIAGNOSIS — J209 Acute bronchitis, unspecified: Secondary | ICD-10-CM | POA: Diagnosis not present

## 2017-01-04 MED ORDER — DOXYCYCLINE HYCLATE 100 MG PO TABS: 100.0000 mg | ORAL_TABLET | Freq: Two times a day (BID) | ORAL | 0 refills | Status: DC

## 2017-01-04 NOTE — Progress Notes (Signed)
Patient: Caleb Miles Male    DOB: [birth year]   46 y.o.   MRN: [id number] Visit Date: 01/04/2017  Today's Provider: Dortha Kernennis Luka Reisch, PA   Chief Complaint  Patient presents with  . Cough   Subjective:    Cough  This is a new problem. The current episode started in the past 7 days. The problem has been gradually worsening. The cough is productive of sputum. Associated symptoms include chills, a fever (patient reports low grade), headaches, nasal congestion, postnasal drip and rhinorrhea. Pertinent negatives include no chest pain, ear congestion, ear pain, heartburn, hemoptysis, myalgias, rash, sore throat, shortness of breath, sweats, weight loss or wheezing. Nothing aggravates the symptoms. Treatments tried: Mucinex D. The treatment provided no relief. His past medical history is significant for environmental allergies. There is no history of asthma, bronchiectasis, bronchitis, COPD, emphysema or pneumonia.    Past Medical History:  Diagnosis Date  . ADHD (attention deficit hyperactivity disorder)   . Anxiety   . Depression    Patient Active Problem List   Diagnosis Date Noted  . ADD (attention deficit disorder) 09/20/2015  . Migraine with aura 06/28/2015  . Depression, major, single episode 11/19/2014  . Allergic rhinitis 11/24/2009  . Anxiety disorder 06/01/2006   Past Surgical History:  Procedure Laterality Date  . TONSILLECTOMY AND ADENOIDECTOMY  22   Family History  Problem Relation Age of Onset  . Emphysema Paternal Grandfather    No Known Allergies  Current Outpatient Prescriptions:  .  meloxicam (MOBIC) 15 MG tablet, Take 1 tablet (15 mg total) by mouth daily., Disp: 30 tablet, Rfl: 3 .  traZODone (DESYREL) 150 MG tablet, , Disp: , Rfl:  .  Vortioxetine HBr (TRINTELLIX PO), Take 15 mg by mouth daily., Disp: , Rfl:   Current Facility-Administered Medications:  .  betamethasone acetate-betamethasone sodium phosphate (CELESTONE) injection 3 mg, 3 mg,  Intramuscular, Once, Felecia ShellingEvans, Brent M, DPM  Review of Systems  Constitutional: Positive for chills and fever (patient reports low grade). Negative for weight loss.  HENT: Positive for postnasal drip and rhinorrhea. Negative for ear pain and sore throat.   Respiratory: Positive for cough. Negative for hemoptysis, shortness of breath and wheezing.   Cardiovascular: Negative for chest pain.  Gastrointestinal: Negative for heartburn.  Musculoskeletal: Negative for myalgias.  Skin: Negative for rash.  Allergic/Immunologic: Positive for environmental allergies.  Neurological: Positive for headaches.  Hematological: Negative.   Psychiatric/Behavioral: Negative.     Social History  Substance Use Topics  . Smoking status: Never Smoker  . Smokeless tobacco: Never Used  . Alcohol use No   Objective:   BP 132/86   Pulse 98   Temp 98.5 F (36.9 C) (Oral)   Resp 16   Wt 199 lb 12.8 oz (90.6 kg)   SpO2 98%   BMI 30.38 kg/m  Vitals:   01/04/17 1050  BP: 132/86  Pulse: 98  Resp: 16  Temp: 98.5 F (36.9 C)  TempSrc: Oral  SpO2: 98%  Weight: 199 lb 12.8 oz (90.6 kg)     Physical Exam  Constitutional: He is oriented to person, place, and time. He appears well-developed and well-nourished. No distress.  HENT:  Head: Normocephalic and atraumatic.  Right Ear: Hearing and external ear normal.  Left Ear: Hearing and external ear normal.  Nose: Nose normal.  Good transillumination of sinuses except slightly cloudy right maxillary. No tenderness throughout. Slight cobblestoning of posterior pharynx.  Eyes: Conjunctivae and lids are normal. Right  eye exhibits no discharge. Left eye exhibits no discharge. No scleral icterus.  Neck: Neck supple.  Cardiovascular: Normal rate.   Pulmonary/Chest: Effort normal and breath sounds normal. No respiratory distress. He has no wheezes.  Slightly coarse breath sounds.  Abdominal: Soft. Bowel sounds are normal.  Musculoskeletal: Normal range of  motion.  Neurological: He is alert and oriented to person, place, and time.  Skin: Skin is intact. No lesion and no rash noted.  Psychiatric: He has a normal mood and affect. His speech is normal and behavior is normal. Thought content normal.      Assessment & Plan:     1. Subacute bronchitis Onset with cough and PND over the past 7 days. Has had some chills and cold sweats despite use of Mucinex-D. May use Tylenol prn and increase fluid intake. Add Doxycycline and Delsym prn cough. Recheck prn. - doxycycline (VIBRA-TABS) 100 MG tablet; Take 1 tablet (100 mg total) by mouth 2 (two) times daily.  Dispense: 20 tablet; Refill: 0  2. Subacute maxillary sinusitis Cloudy transillumination of the right maxillary sinus without pain. Having significant amount of PND triggering cough and early bronchitis. Treat as above and recheck prn.       Dortha Kern, PA  [facility]

## 2017-01-04 NOTE — Patient Instructions (Signed)

## 2017-01-12 DIAGNOSIS — F3342 Major depressive disorder, recurrent, in full remission: Secondary | ICD-10-CM | POA: Diagnosis not present

## 2017-01-19 ENCOUNTER — Ambulatory Visit: Payer: BLUE CROSS/BLUE SHIELD | Admitting: Podiatry

## 2017-02-06 ENCOUNTER — Ambulatory Visit: Payer: BLUE CROSS/BLUE SHIELD | Admitting: Podiatry

## 2017-02-13 ENCOUNTER — Ambulatory Visit (INDEPENDENT_AMBULATORY_CARE_PROVIDER_SITE_OTHER): Payer: BLUE CROSS/BLUE SHIELD | Admitting: Family Medicine

## 2017-02-13 ENCOUNTER — Encounter: Payer: Self-pay | Admitting: Family Medicine

## 2017-02-13 VITALS — BP 110/80 | HR 75 | Temp 98.5°F | Resp 16 | Wt 205.8 lb

## 2017-02-13 DIAGNOSIS — R0683 Snoring: Secondary | ICD-10-CM

## 2017-02-13 DIAGNOSIS — R400 Somnolence: Secondary | ICD-10-CM

## 2017-02-13 DIAGNOSIS — Z8349 Family history of other endocrine, nutritional and metabolic diseases: Secondary | ICD-10-CM | POA: Insufficient documentation

## 2017-02-13 NOTE — Progress Notes (Signed)
Subjective:     Patient ID: Caleb Miles, male   DOB: 1970/09/29, 46 y.o.   MRN: [id number]  HPI  Chief Complaint  Patient presents with  . Fatigue    Patient comes in office today with concerns of fatigue for over the past 3 months or more. Patient averages between 5hrs a sleep at night waking up about 4-5x a niight. Patient states that it has affected his ability to concentrate throughout the day and has reported falling asleep during day.   States his Fitbit records 10 awakenings nightly. States snoring has been an ongoing problem for years. He has gained weight out of lack of energy and desire to exercise. Feels depression is under control with current medication. (followed by Dr. Evelene CroonKaur).States his father has been recently diagnosed with hemochromatosis.   Review of Systems     Objective:   Physical Exam  Constitutional: He appears well-developed and well-nourished. No distress.  HENT:  Tonsils absent; no oral obstruction noted       Assessment:    1. Snoring - Ambulatory referral to Sleep Studies  2. Daytime sleepiness - Ambulatory referral to Sleep Studies  3. Family history of hemochromatosis - CBC with Differential/Platelet - Ferritin    Plan:    Further f/u pending work and sleep study results.

## 2017-02-13 NOTE — Patient Instructions (Signed)
We will call you with lab results and about the sleep study.

## 2017-02-14 LAB — CBC WITH DIFFERENTIAL/PLATELET
BASOS: 1 %
Basophils Absolute: 0 10*3/uL (ref 0.0–0.2)
EOS (ABSOLUTE): 0 10*3/uL (ref 0.0–0.4)
Eos: 1 %
HEMOGLOBIN: 14.3 g/dL (ref 13.0–17.7)
Hematocrit: 42.5 % (ref 37.5–51.0)
IMMATURE GRANS (ABS): 0 10*3/uL (ref 0.0–0.1)
IMMATURE GRANULOCYTES: 0 %
Lymphocytes Absolute: 1.9 10*3/uL (ref 0.7–3.1)
Lymphs: 33 %
MCH: 31.5 pg (ref 26.6–33.0)
MCHC: 33.6 g/dL (ref 31.5–35.7)
MCV: 94 fL (ref 79–97)
MONOCYTES: 6 %
Monocytes Absolute: 0.4 10*3/uL (ref 0.1–0.9)
NEUTROS PCT: 59 %
Neutrophils Absolute: 3.3 10*3/uL (ref 1.4–7.0)
Platelets: 224 10*3/uL (ref 150–379)
RBC: 4.54 x10E6/uL (ref 4.14–5.80)
RDW: 13.1 % (ref 12.3–15.4)
WBC: 5.7 10*3/uL (ref 3.4–10.8)

## 2017-02-14 LAB — FERRITIN: Ferritin: 209 ng/mL (ref 30–400)

## 2017-02-14 NOTE — Progress Notes (Signed)
Advised  ED 

## 2017-03-22 ENCOUNTER — Telehealth: Payer: Self-pay | Admitting: Family Medicine

## 2017-03-22 NOTE — Telephone Encounter (Signed)
FYI--[facility] who states that they have made 3 attempts to contact pt without return phone call.They have also mailed letter to pt for him to contact their office.Sleep study has not been scheduled

## 2017-03-22 NOTE — Telephone Encounter (Signed)
The ball is in his court.

## 2017-03-22 NOTE — Telephone Encounter (Signed)
Left message for patient to call me back, please advise. KW

## 2017-03-23 ENCOUNTER — Encounter: Payer: Self-pay | Admitting: Family Medicine

## 2017-03-23 ENCOUNTER — Ambulatory Visit (INDEPENDENT_AMBULATORY_CARE_PROVIDER_SITE_OTHER): Payer: BLUE CROSS/BLUE SHIELD | Admitting: Family Medicine

## 2017-03-23 VITALS — BP 100/60 | HR 77 | Temp 98.9°F | Resp 16 | Wt 203.8 lb

## 2017-03-23 DIAGNOSIS — R5383 Other fatigue: Secondary | ICD-10-CM

## 2017-03-23 DIAGNOSIS — G43109 Migraine with aura, not intractable, without status migrainosus: Secondary | ICD-10-CM | POA: Diagnosis not present

## 2017-03-23 NOTE — Patient Instructions (Signed)
We will call you with the lab results. Do call about setting up the sleep study.

## 2017-03-23 NOTE — Progress Notes (Signed)
Subjective:     Patient ID: Caleb Miles, male   DOB: 08/13/1971, 46 y.o.   MRN: [id number]  HPI  Chief Complaint  Patient presents with  . Migraine    Patient comes in office today with complaints of migraine headaches for the past 72hrs. Patient states associated he has had fatigue and increased anxiety. Patient has been taking otc Excedrin.   States headache has nearly resolved with use of Excedrin. States he has a lot of fatigue and wishes further evaluation. We had referred him for a sleep study but he has not set this up yet. He has f/u with his psychiatrist, Dr Evelene CroonKaur, next week. He has had recent normal CBC and ferritin.   Review of Systems     Objective:   Physical Exam  Constitutional: He appears well-developed and well-nourished. No distress.  Psychiatric: He has a normal mood and affect. His behavior is normal.       Assessment:    1. Migraine with aura and without status migrainosus, not intractable: resolving 2. Fatigue, unspecified type  - Comprehensive metabolic panel - VITAMIN D 25 Hydroxy (Vit-D Deficiency, Fractures) - T4, free - TSH    Plan:    Further f/u pending lab work.

## 2017-03-28 DIAGNOSIS — F331 Major depressive disorder, recurrent, moderate: Secondary | ICD-10-CM | POA: Diagnosis not present

## 2017-05-29 DIAGNOSIS — F900 Attention-deficit hyperactivity disorder, predominantly inattentive type: Secondary | ICD-10-CM | POA: Diagnosis not present

## 2017-05-29 DIAGNOSIS — F3342 Major depressive disorder, recurrent, in full remission: Secondary | ICD-10-CM | POA: Diagnosis not present

## 2017-08-08 DIAGNOSIS — S6991XA Unspecified injury of right wrist, hand and finger(s), initial encounter: Secondary | ICD-10-CM | POA: Diagnosis not present

## 2017-08-08 DIAGNOSIS — S63501A Unspecified sprain of right wrist, initial encounter: Secondary | ICD-10-CM | POA: Diagnosis not present

## 2017-10-09 ENCOUNTER — Encounter: Payer: Self-pay | Admitting: Family Medicine

## 2017-10-09 ENCOUNTER — Ambulatory Visit: Payer: BLUE CROSS/BLUE SHIELD | Admitting: Family Medicine

## 2017-10-09 VITALS — BP 124/72 | HR 83 | Temp 97.7°F | Resp 16 | Wt 197.2 lb

## 2017-10-09 DIAGNOSIS — J0100 Acute maxillary sinusitis, unspecified: Secondary | ICD-10-CM | POA: Diagnosis not present

## 2017-10-09 MED ORDER — AMOXICILLIN-POT CLAVULANATE 875-125 MG PO TABS: 1.0000 | ORAL_TABLET | Freq: Two times a day (BID) | ORAL | 0 refills | Status: DC

## 2017-10-09 NOTE — Patient Instructions (Signed)
Continue Mucinex D. 

## 2017-10-09 NOTE — Progress Notes (Signed)
Subjective:     Patient ID: Caleb Miles, male   DOB: [birth year], 47 y.o.   MRN: [id number] Chief Complaint  Patient presents with  . Sinus Problem    Patient comes in office today with complaints of sinus pain and pressure throughout his head for the past 2 weeks. Patient reports associated with sinus pressure he has had fullness in both ears and reports a sore throat. Patient has tried otc Mucinex D and Tylenol.   HPI Patient reports increased sinus pressure, purulent sinus drainage, post nasal drainage and recurrent throat clearing.  Review of Systems  Psychiatric/Behavioral:       Followed by psychiatry, Dr. Evelene CroonKaur.       Objective:   Physical Exam  Constitutional: He appears well-developed and well-nourished. No distress.  Ears: T.M's intact without inflammation Sinuses: moderate maxillary sinus tenderness Throat: tonsils absent, no erythema Neck: no cervical adenopathy Lungs: clear     Assessment:    1. Acute maxillary sinusitis, recurrence not specified - amoxicillin-clavulanate (AUGMENTIN) 875-125 MG tablet; Take 1 tablet by mouth 2 (two) times daily.  Dispense: 20 tablet; Refill: 0    Plan:    Continue Mucinex D

## 2017-10-25 ENCOUNTER — Encounter: Payer: Self-pay | Admitting: Family Medicine

## 2017-10-25 ENCOUNTER — Other Ambulatory Visit: Payer: Self-pay | Admitting: Family Medicine

## 2017-10-25 ENCOUNTER — Ambulatory Visit (INDEPENDENT_AMBULATORY_CARE_PROVIDER_SITE_OTHER): Payer: BLUE CROSS/BLUE SHIELD | Admitting: Family Medicine

## 2017-10-25 VITALS — BP 130/78 | HR 96 | Temp 98.5°F | Resp 16 | Wt 196.0 lb

## 2017-10-25 DIAGNOSIS — K529 Noninfective gastroenteritis and colitis, unspecified: Secondary | ICD-10-CM | POA: Diagnosis not present

## 2017-10-25 NOTE — Progress Notes (Signed)
Subjective:     Patient ID: Caleb Miles, male   DOB: 04/28/1971, 47 y.o.   MRN: [id number] Chief Complaint  Patient presents with  . Emesis    Patient c/o nausea, vomiting X 3 days. He reports that his family has had the same symptoms along with diarrhea. He has tried increasing his fluids and Tylenol to help with the symptoms. He also reports that he has had fever up to 101.    HPI States he feels improved today but has missed work all week. Did not develop diarrhea. Reports his son first got sick after they ate at a restaurant.  Review of Systems     Objective:   Physical Exam  Constitutional: He appears well-developed and well-nourished. No distress.  Abdominal: Soft. There is tenderness (mild RUQ). There is no guarding.       Assessment:    1. Gastroenteritis: spontaneously improved.    Plan:    Discussed eating as tolerated. Imodium if needed.

## 2017-10-25 NOTE — Patient Instructions (Signed)
Resume eating as tolerated. Imodium as needed for diarrhea.

## 2017-11-20 DIAGNOSIS — F341 Dysthymic disorder: Secondary | ICD-10-CM | POA: Diagnosis not present

## 2017-11-20 DIAGNOSIS — F331 Major depressive disorder, recurrent, moderate: Secondary | ICD-10-CM | POA: Diagnosis not present

## 2017-11-20 DIAGNOSIS — F900 Attention-deficit hyperactivity disorder, predominantly inattentive type: Secondary | ICD-10-CM | POA: Diagnosis not present

## 2018-01-29 DIAGNOSIS — F900 Attention-deficit hyperactivity disorder, predominantly inattentive type: Secondary | ICD-10-CM | POA: Diagnosis not present

## 2018-01-29 DIAGNOSIS — F331 Major depressive disorder, recurrent, moderate: Secondary | ICD-10-CM | POA: Diagnosis not present

## 2018-04-10 DIAGNOSIS — F900 Attention-deficit hyperactivity disorder, predominantly inattentive type: Secondary | ICD-10-CM | POA: Diagnosis not present

## 2018-04-10 DIAGNOSIS — F3342 Major depressive disorder, recurrent, in full remission: Secondary | ICD-10-CM | POA: Diagnosis not present

## 2018-04-25 ENCOUNTER — Telehealth: Payer: Self-pay | Admitting: Family Medicine

## 2018-04-25 NOTE — Telephone Encounter (Signed)
Referral in progress. 

## 2018-04-25 NOTE — Telephone Encounter (Signed)
Please review. KW 

## 2018-04-25 NOTE — Telephone Encounter (Signed)
Pt states he was referred for a sleep study over 6 mths ago and he was not able to go.  Pt is wanting to get a referral again to have a sleep study.

## 2018-06-27 DIAGNOSIS — F3342 Major depressive disorder, recurrent, in full remission: Secondary | ICD-10-CM | POA: Diagnosis not present

## 2018-06-27 DIAGNOSIS — F900 Attention-deficit hyperactivity disorder, predominantly inattentive type: Secondary | ICD-10-CM | POA: Diagnosis not present

## 2018-07-12 ENCOUNTER — Ambulatory Visit: Payer: BLUE CROSS/BLUE SHIELD | Attending: Otolaryngology

## 2018-07-12 DIAGNOSIS — G4733 Obstructive sleep apnea (adult) (pediatric): Secondary | ICD-10-CM | POA: Insufficient documentation

## 2018-07-12 DIAGNOSIS — F5104 Psychophysiologic insomnia: Secondary | ICD-10-CM | POA: Insufficient documentation

## 2018-07-12 DIAGNOSIS — F5101 Primary insomnia: Secondary | ICD-10-CM | POA: Diagnosis not present

## 2018-08-12 ENCOUNTER — Encounter: Payer: Self-pay | Admitting: Family Medicine

## 2018-08-12 ENCOUNTER — Ambulatory Visit: Payer: BLUE CROSS/BLUE SHIELD | Admitting: Family Medicine

## 2018-08-12 VITALS — BP 124/70 | HR 90 | Temp 98.9°F | Resp 16 | Wt 199.0 lb

## 2018-08-12 DIAGNOSIS — J0100 Acute maxillary sinusitis, unspecified: Secondary | ICD-10-CM | POA: Diagnosis not present

## 2018-08-12 MED ORDER — CEFDINIR 300 MG PO CAPS: 300.0000 mg | ORAL_CAPSULE | Freq: Two times a day (BID) | ORAL | 0 refills | Status: DC

## 2018-08-12 MED ORDER — PREDNISONE 20 MG PO TABS: ORAL_TABLET | ORAL | 0 refills | Status: DC

## 2018-08-12 NOTE — Progress Notes (Signed)
  Subjective:     Patient ID: Caleb Miles, male   DOB: 10/12/1970, 47 y.o.   MRN: [id number] Chief Complaint  Patient presents with  . Sinus Problem    Patient comes in office today with concerns with sinus pain and pressure for the past 2 weeks. Patient states that symptoms have gradually been worse over the past few days. Patient reports sore throat, congestion, cough, right sided ear pain and a low grade fever of 99.0. Patient has tried otc Benadryl, Mucinex D and Zyrtec   HPI Reports thick PND which provokes cough but little sinus drainage. States he feels pressure below his eyes and towards the back of his head.  Review of Systems     Objective:   Physical Exam Constitutional:      General: He is not in acute distress.    Appearance: He is not ill-appearing.  Neurological:     Mental Status: He is alert.   Ears: T.M's intact without inflammation Sinuses: moderate maxillary sinus tenderness Throat: tonsils absent without erythema Neck: no cervical adenopathy Lungs: clear     Assessment:    1. Acute non-recurrent maxillary sinusitis - predniSONE (DELTASONE) 20 MG tablet; One pill twice daily for 5 days  Dispense: 10 tablet; Refill: 0 - cefdinir (OMNICEF) 300 MG capsule; Take 1 capsule (300 mg total) by mouth 2 (two) times daily.  Dispense: 20 capsule; Refill: 0    Plan:    Continue Mucinex D

## 2018-08-12 NOTE — Patient Instructions (Signed)
Continue Mucinex D. We will call with results of the sleep study when available. Let me know if sinuses not improving.

## 2018-09-05 ENCOUNTER — Ambulatory Visit: Payer: BLUE CROSS/BLUE SHIELD | Attending: Otolaryngology

## 2018-09-05 DIAGNOSIS — G4733 Obstructive sleep apnea (adult) (pediatric): Secondary | ICD-10-CM | POA: Diagnosis not present

## 2018-09-05 DIAGNOSIS — F5101 Primary insomnia: Secondary | ICD-10-CM | POA: Diagnosis not present

## 2018-09-06 ENCOUNTER — Ambulatory Visit (INDEPENDENT_AMBULATORY_CARE_PROVIDER_SITE_OTHER): Payer: BLUE CROSS/BLUE SHIELD | Admitting: Family Medicine

## 2018-09-06 ENCOUNTER — Encounter: Payer: Self-pay | Admitting: Family Medicine

## 2018-09-06 ENCOUNTER — Other Ambulatory Visit: Payer: Self-pay

## 2018-09-06 VITALS — BP 136/80 | HR 92 | Temp 97.7°F | Ht 67.0 in | Wt 203.0 lb

## 2018-09-06 DIAGNOSIS — J0101 Acute recurrent maxillary sinusitis: Secondary | ICD-10-CM | POA: Diagnosis not present

## 2018-09-06 MED ORDER — LEVOFLOXACIN 750 MG PO TABS: 750.0000 mg | ORAL_TABLET | Freq: Every day | ORAL | 0 refills | Status: DC

## 2018-09-06 NOTE — Patient Instructions (Signed)
Discussed use of saline spray or Neti Pot. We will call you with the ENT referral.

## 2018-09-06 NOTE — Progress Notes (Signed)
  Subjective:     Patient ID: Caleb Miles, male   DOB: [birth year], 48 y.o.   MRN: [id number] Chief Complaint  Patient presents with  . Sinusitis    pressure, sinus pain, congestion with  yellow mucus, headache since 08/12/18   HPI Reports he did not resolve his sinus infection  after rx with prednisone and cefdinir. Also just had his CPAP titration study but felt it was suboptimal due to his sinus congestion. Wishes treatment today and referral to ENT.  Review of Systems     Objective:   Physical Exam Constitutional:      General: He is not in acute distress.    Appearance: He is not ill-appearing.  Neurological:     Mental Status: He is alert.   Ears: T.M's intact without inflammation Sinuses: mild R>L maxillary sinus tenderness Throat: tonsils absent Neck: no cervical adenopathy Lungs: clear     Assessment:    1. Acute recurrent maxillary sinusitis: Levaquin 750 mg. X 5 days - Ambulatory referral to ENT    Plan:    Encouraged use of saline spray and/or Neti Pot.

## 2018-09-13 ENCOUNTER — Telehealth: Payer: Self-pay

## 2018-09-13 ENCOUNTER — Other Ambulatory Visit: Payer: Self-pay | Admitting: Family Medicine

## 2018-09-13 DIAGNOSIS — G4733 Obstructive sleep apnea (adult) (pediatric): Secondary | ICD-10-CM | POA: Insufficient documentation

## 2018-09-13 NOTE — Telephone Encounter (Signed)
Faxed demographics, office notes, CPAP rx, insurance information and sleep study to [facility] AT [phone] AT 8:50AM ON 09/13/2018.  DBS

## 2018-09-23 ENCOUNTER — Telehealth: Payer: Self-pay | Admitting: Family Medicine

## 2018-09-23 NOTE — Telephone Encounter (Signed)
Pt had sleep study done.  Pt hasn't heard back regarding his CPAP machine.  Please let pt know on this asap.  Thanks, [employer]

## 2018-09-23 NOTE — Telephone Encounter (Signed)
Advised pt that we had to send [facility] the baseline sleep study they were missing.  I faxed [facility] the report on 09/23/18 and advised pt that Christoper Allegra will contact him as soon as they get everything approved.  dbs

## 2018-09-27 DIAGNOSIS — J3489 Other specified disorders of nose and nasal sinuses: Secondary | ICD-10-CM | POA: Diagnosis not present

## 2018-09-27 DIAGNOSIS — J320 Chronic maxillary sinusitis: Secondary | ICD-10-CM | POA: Diagnosis not present

## 2018-09-27 DIAGNOSIS — G4733 Obstructive sleep apnea (adult) (pediatric): Secondary | ICD-10-CM | POA: Diagnosis not present

## 2018-09-27 DIAGNOSIS — J342 Deviated nasal septum: Secondary | ICD-10-CM | POA: Diagnosis not present

## 2018-10-03 ENCOUNTER — Inpatient Hospital Stay
Admission: RE | Admit: 2018-10-03 | Discharge: 2018-10-03 | Disposition: A | Discharge status: Home / Self Care | Payer: BLUE CROSS/BLUE SHIELD | Source: Ambulatory Visit

## 2018-10-04 ENCOUNTER — Inpatient Hospital Stay: Admission: RE | Admit: 2018-10-04 | Payer: BLUE CROSS/BLUE SHIELD | Source: Ambulatory Visit

## 2018-10-07 ENCOUNTER — Inpatient Hospital Stay: Admission: RE | Admit: 2018-10-07 | Payer: BLUE CROSS/BLUE SHIELD | Source: Ambulatory Visit

## 2018-10-08 ENCOUNTER — Encounter
Admission: RE | Admit: 2018-10-08 | Discharge: 2018-10-08 | Disposition: A | Discharge status: Home / Self Care | Payer: BLUE CROSS/BLUE SHIELD | Source: Ambulatory Visit | Attending: Otolaryngology | Admitting: Otolaryngology

## 2018-10-08 ENCOUNTER — Other Ambulatory Visit: Payer: Self-pay

## 2018-10-08 HISTORY — DX: Sleep apnea, unspecified: G47.30

## 2018-10-08 HISTORY — DX: Headache: R51

## 2018-10-08 HISTORY — DX: Hypothyroidism, unspecified: E03.9

## 2018-10-08 HISTORY — DX: Personal history of urinary calculi: Z87.442

## 2018-10-08 HISTORY — DX: Headache, unspecified: R51.9

## 2018-10-08 NOTE — Patient Instructions (Signed)
Your procedure is scheduled on: 10-31-18 Report to Same Day Surgery 2nd floor medical mall [facility] Entrance-take elevator on left to 2nd floor.  Check in with surgery information desk.) To find out your arrival time please call [phone] between 1PM - 3PM on 10-30-18  Remember: Instructions that are not followed completely may result in serious medical risk, up to and including death, or upon the discretion of your surgeon and anesthesiologist your surgery may need to be rescheduled.    _x___ 1. Do not eat food after midnight the night before your procedure. You may drink clear liquids up to 2 hours before you are scheduled to arrive at the hospital for your procedure.  Do not drink clear liquids within 2 hours of your scheduled arrival to the hospital.  Clear liquids include  --Water or Apple juice without pulp  --Clear carbohydrate beverage such as ClearFast or Gatorade  --Black Coffee or Clear Tea (No milk, no creamers, do not add anything to the coffee or Tea   ____Ensure clear carbohydrate drink on the way to the hospital for bariatric patients  ____Ensure clear carbohydrate drink 3 hours before surgery for Dr Rutherford Nail patients if physician instructed.    __x__ 2. No Alcohol for 24 hours before or after surgery.   __x__3. No Smoking or e-cigarettes for 24 prior to surgery.  Do not use any chewable tobacco products for at least 6 hour prior to surgery   ____  4. Bring all medications with you on the day of surgery if instructed.    __x__ 5. Notify your doctor if there is any change in your medical condition     (cold, fever, infections).    x___6. On the morning of surgery brush your teeth with toothpaste and water.  You may rinse your mouth with mouth wash if you wish.  Do not swallow any toothpaste or mouthwash.   Do not wear jewelry, make-up, hairpins, clips or nail polish.  Do not wear lotions, powders, or perfumes. You may wear deodorant.  Do not shave 48 hours prior  to surgery. Men may shave face and neck.  Do not bring valuables to the hospital.    [facility] is not responsible for any belongings or valuables.               Contacts, dentures or bridgework may not be worn into surgery.  Leave your suitcase in the car. After surgery it may be brought to your room.  For patients admitted to the hospital, discharge time is determined by your treatment team.  _  Patients discharged the day of surgery will not be allowed to drive home.  You will need someone to drive you home and stay with you the night of your procedure.    Please read over the following fact sheets that you were given:   [facility] Preparing for Surgery   _x___ Take anti-hypertensive listed below, cardiac, seizure, asthma, anti-reflux and psychiatric medicines. These include:  1. LEVOTHYROXINE  2.  3.  4.  5.  6.  ____Fleets enema or Magnesium Citrate as directed.   ____ Use CHG Soap or sage wipes as directed on instruction sheet   ____ Use inhalers on the day of surgery and bring to hospital day of surgery  ____ Stop Metformin and Janumet 2 days prior to surgery.    ____ Take 1/2 of usual insulin dose the night before surgery and none on the morning surgery.   ____ Follow recommendations from  Cardiologist, Pulmonologist or PCP regarding stopping Aspirin, Coumadin, Plavix ,Eliquis, Effient, or Pradaxa, and Pletal.  X____Stop Anti-inflammatories such as Advil, Aleve, Ibuprofen, Motrin, Naproxen, Naprosyn, Goodies powders or aspirin products 7 DAYS PRIOR TO SURGERY-OK to take Tylenol   ____ Stop supplements until after surgery.    ____ Bring C-Pap to the hospital.

## 2018-10-09 ENCOUNTER — Telehealth: Payer: Self-pay | Admitting: Family Medicine

## 2018-10-09 NOTE — Telephone Encounter (Signed)
Patient notified report left upfront for pick up. KW

## 2018-10-09 NOTE — Telephone Encounter (Signed)
Pt needing to come by to pick up his recent sleep study results for his dentist appt.  He needs to pick it up in 30 to 45 mins.  Thanks, [employer]

## 2018-10-31 ENCOUNTER — Ambulatory Visit: Payer: BLUE CROSS/BLUE SHIELD | Admitting: Certified Registered Nurse Anesthetist

## 2018-10-31 ENCOUNTER — Ambulatory Visit
Admission: RE | Admit: 2018-10-31 | Discharge: 2018-10-31 | Disposition: A | Discharge status: Home / Self Care | Payer: BLUE CROSS/BLUE SHIELD | Attending: Otolaryngology | Admitting: Otolaryngology

## 2018-10-31 ENCOUNTER — Other Ambulatory Visit: Payer: Self-pay

## 2018-10-31 ENCOUNTER — Encounter: Payer: Self-pay | Admitting: *Deleted

## 2018-10-31 ENCOUNTER — Encounter
Admission: RE | Disposition: A | Discharge status: Home / Self Care | Payer: Self-pay | Source: Home / Self Care | Attending: Otolaryngology

## 2018-10-31 DIAGNOSIS — Z87442 Personal history of urinary calculi: Secondary | ICD-10-CM | POA: Diagnosis not present

## 2018-10-31 DIAGNOSIS — J320 Chronic maxillary sinusitis: Secondary | ICD-10-CM | POA: Diagnosis not present

## 2018-10-31 DIAGNOSIS — J343 Hypertrophy of nasal turbinates: Secondary | ICD-10-CM | POA: Insufficient documentation

## 2018-10-31 DIAGNOSIS — F909 Attention-deficit hyperactivity disorder, unspecified type: Secondary | ICD-10-CM | POA: Diagnosis not present

## 2018-10-31 DIAGNOSIS — R51 Headache: Secondary | ICD-10-CM | POA: Insufficient documentation

## 2018-10-31 DIAGNOSIS — J342 Deviated nasal septum: Secondary | ICD-10-CM | POA: Diagnosis not present

## 2018-10-31 DIAGNOSIS — F329 Major depressive disorder, single episode, unspecified: Secondary | ICD-10-CM | POA: Diagnosis not present

## 2018-10-31 DIAGNOSIS — G4730 Sleep apnea, unspecified: Secondary | ICD-10-CM | POA: Insufficient documentation

## 2018-10-31 DIAGNOSIS — F419 Anxiety disorder, unspecified: Secondary | ICD-10-CM | POA: Diagnosis not present

## 2018-10-31 DIAGNOSIS — E039 Hypothyroidism, unspecified: Secondary | ICD-10-CM | POA: Diagnosis not present

## 2018-10-31 DIAGNOSIS — E669 Obesity, unspecified: Secondary | ICD-10-CM | POA: Diagnosis not present

## 2018-10-31 DIAGNOSIS — J329 Chronic sinusitis, unspecified: Secondary | ICD-10-CM | POA: Diagnosis not present

## 2018-10-31 DIAGNOSIS — Z6830 Body mass index (BMI) 30.0-30.9, adult: Secondary | ICD-10-CM | POA: Insufficient documentation

## 2018-10-31 HISTORY — PX: IMAGE GUIDED SINUS SURGERY: SHX6570

## 2018-10-31 HISTORY — PX: NASAL SEPTOPLASTY W/ TURBINOPLASTY: SHX2070

## 2018-10-31 HISTORY — PX: MAXILLARY ANTROSTOMY: SHX2003

## 2018-10-31 SURGERY — SINUS SURGERY, WITH IMAGING GUIDANCE
Anesthesia: General | Laterality: Right

## 2018-10-31 MED ORDER — LIDOCAINE HCL (CARDIAC) PF 100 MG/5ML IV SOSY
PREFILLED_SYRINGE | INTRAVENOUS | Status: DC | PRN
  Administered 2018-10-31: 100 mg via INTRAVENOUS

## 2018-10-31 MED ORDER — LIDOCAINE-EPINEPHRINE 1 %-1:100000 IJ SOLN
INTRAMUSCULAR | Status: DC | PRN
  Administered 2018-10-31: 4.5 mL

## 2018-10-31 MED ORDER — MEPERIDINE HCL 50 MG/ML IJ SOLN: 6.2500 mg | INTRAMUSCULAR | Status: DC | PRN

## 2018-10-31 MED ORDER — ROCURONIUM BROMIDE 50 MG/5ML IV SOLN
INTRAVENOUS | Status: AC
  Filled 2018-10-31: qty 1

## 2018-10-31 MED ORDER — PROMETHAZINE HCL 25 MG/ML IJ SOLN: 6.2500 mg | INTRAMUSCULAR | Status: DC | PRN

## 2018-10-31 MED ORDER — SUGAMMADEX SODIUM 200 MG/2ML IV SOLN
INTRAVENOUS | Status: AC
  Filled 2018-10-31: qty 2

## 2018-10-31 MED ORDER — ONDANSETRON HCL 4 MG/2ML IJ SOLN
INTRAMUSCULAR | Status: AC
  Filled 2018-10-31: qty 2

## 2018-10-31 MED ORDER — LIDOCAINE-EPINEPHRINE 1 %-1:100000 IJ SOLN
INTRAMUSCULAR | Status: AC
  Filled 2018-10-31: qty 1

## 2018-10-31 MED ORDER — DEXAMETHASONE SODIUM PHOSPHATE 10 MG/ML IJ SOLN
INTRAMUSCULAR | Status: AC
  Filled 2018-10-31: qty 1

## 2018-10-31 MED ORDER — ARTIFICIAL TEARS OPHTHALMIC OINT
TOPICAL_OINTMENT | OPHTHALMIC | Status: AC
  Filled 2018-10-31: qty 3.5

## 2018-10-31 MED ORDER — ONDANSETRON HCL 4 MG/2ML IJ SOLN
INTRAMUSCULAR | Status: DC | PRN
  Administered 2018-10-31: 4 mg via INTRAVENOUS

## 2018-10-31 MED ORDER — MIDAZOLAM HCL 2 MG/2ML IJ SOLN
INTRAMUSCULAR | Status: AC
  Filled 2018-10-31: qty 2

## 2018-10-31 MED ORDER — PROPOFOL 10 MG/ML IV BOLUS
INTRAVENOUS | Status: DC | PRN
  Administered 2018-10-31: 200 mg via INTRAVENOUS

## 2018-10-31 MED ORDER — FENTANYL CITRATE (PF) 100 MCG/2ML IJ SOLN
INTRAMUSCULAR | Status: DC | PRN
  Administered 2018-10-31: 50 ug via INTRAVENOUS
  Administered 2018-10-31 (×2): 25 ug via INTRAVENOUS
  Administered 2018-10-31: 100 ug via INTRAVENOUS

## 2018-10-31 MED ORDER — AMOXICILLIN-POT CLAVULANATE 875-125 MG PO TABS: 1.0000 | ORAL_TABLET | Freq: Two times a day (BID) | ORAL | 0 refills | Status: DC

## 2018-10-31 MED ORDER — SUCCINYLCHOLINE CHLORIDE 20 MG/ML IJ SOLN
INTRAMUSCULAR | Status: AC
  Filled 2018-10-31: qty 1

## 2018-10-31 MED ORDER — BACITRACIN 500 UNIT/GM EX OINT
TOPICAL_OINTMENT | CUTANEOUS | Status: DC | PRN
  Administered 2018-10-31: 1 via TOPICAL

## 2018-10-31 MED ORDER — OXYMETAZOLINE HCL 0.05 % NA SOLN
NASAL | Status: AC
  Filled 2018-10-31: qty 60

## 2018-10-31 MED ORDER — LIDOCAINE HCL (PF) 2 % IJ SOLN
INTRAMUSCULAR | Status: AC
  Filled 2018-10-31: qty 10

## 2018-10-31 MED ORDER — FAMOTIDINE 20 MG PO TABS
ORAL_TABLET | ORAL | Status: AC
  Administered 2018-10-31: 20 mg via ORAL
  Filled 2018-10-31: qty 1

## 2018-10-31 MED ORDER — ROCURONIUM BROMIDE 100 MG/10ML IV SOLN
INTRAVENOUS | Status: DC | PRN
  Administered 2018-10-31: 10 mg via INTRAVENOUS
  Administered 2018-10-31: 50 mg via INTRAVENOUS

## 2018-10-31 MED ORDER — ONDANSETRON HCL 4 MG PO TABS: 4.0000 mg | ORAL_TABLET | Freq: Three times a day (TID) | ORAL | 0 refills | Status: DC | PRN

## 2018-10-31 MED ORDER — PHENYLEPHRINE HCL 10 MG/ML IJ SOLN
INTRAMUSCULAR | Status: DC | PRN
  Administered 2018-10-31 (×5): 100 ug via INTRAVENOUS

## 2018-10-31 MED ORDER — SUGAMMADEX SODIUM 500 MG/5ML IV SOLN
INTRAVENOUS | Status: DC | PRN
  Administered 2018-10-31: 200 mg via INTRAVENOUS

## 2018-10-31 MED ORDER — PROPOFOL 10 MG/ML IV BOLUS
INTRAVENOUS | Status: AC
  Filled 2018-10-31: qty 20

## 2018-10-31 MED ORDER — OXYCODONE-ACETAMINOPHEN 5-325 MG PO TABS: 1.0000 | ORAL_TABLET | ORAL | 0 refills | Status: AC | PRN

## 2018-10-31 MED ORDER — OXYCODONE HCL 5 MG PO TABS: 5.0000 mg | ORAL_TABLET | Freq: Once | ORAL | Status: DC | PRN

## 2018-10-31 MED ORDER — FENTANYL CITRATE (PF) 100 MCG/2ML IJ SOLN
INTRAMUSCULAR | Status: AC
  Filled 2018-10-31: qty 2

## 2018-10-31 MED ORDER — LACTATED RINGERS IV SOLN
INTRAVENOUS | Status: DC
  Administered 2018-10-31: 08:00:00 via INTRAVENOUS

## 2018-10-31 MED ORDER — BACITRACIN ZINC 500 UNIT/GM EX OINT
TOPICAL_OINTMENT | CUTANEOUS | Status: AC
  Filled 2018-10-31: qty 28.35

## 2018-10-31 MED ORDER — OXYCODONE HCL 5 MG/5ML PO SOLN: 5.0000 mg | Freq: Once | ORAL | Status: DC | PRN

## 2018-10-31 MED ORDER — FAMOTIDINE 20 MG PO TABS
20.0000 mg | ORAL_TABLET | Freq: Once | ORAL | Status: AC
  Administered 2018-10-31: 20 mg via ORAL

## 2018-10-31 MED ORDER — FENTANYL CITRATE (PF) 100 MCG/2ML IJ SOLN: 25.0000 ug | INTRAMUSCULAR | Status: DC | PRN

## 2018-10-31 MED ORDER — OXYMETAZOLINE HCL 0.05 % NA SOLN
NASAL | Status: DC | PRN
  Administered 2018-10-31: 1

## 2018-10-31 MED ORDER — MIDAZOLAM HCL 2 MG/2ML IJ SOLN
INTRAMUSCULAR | Status: DC | PRN
  Administered 2018-10-31: 2 mg via INTRAVENOUS

## 2018-10-31 SURGICAL SUPPLY — 42 items
BATTERY INSTRU NAVIGATION (MISCELLANEOUS) ×10 IMPLANT
BLADE SURG 15 STRL LF DISP TIS (BLADE) ×3 IMPLANT
BLADE SURG 15 STRL SS (BLADE) ×2
BNDG EYE OVAL (GAUZE/BANDAGES/DRESSINGS) ×5 IMPLANT
CANISTER SUC SOCK COL 7IN (MISCELLANEOUS) ×5 IMPLANT
CANISTER SUCT 1200ML W/VALVE (MISCELLANEOUS) ×5 IMPLANT
CANISTER SUCT 3000ML PPV (MISCELLANEOUS) ×5 IMPLANT
CNTNR SPEC 2.5X3XGRAD LEK (MISCELLANEOUS) ×6
COAG SUCT 10F 3.5MM HAND CTRL (MISCELLANEOUS) ×5 IMPLANT
CONT SPEC 4OZ STER OR WHT (MISCELLANEOUS) ×4
CONTAINER SPEC 2.5X3XGRAD LEK (MISCELLANEOUS) ×6 IMPLANT
COVER WAND RF STERILE (DRAPES) ×5 IMPLANT
DRESSING NASL FOAM PST OP SINU (MISCELLANEOUS) IMPLANT
DRSG NASAL FOAM POST OP SINU (MISCELLANEOUS) ×5
ELECT REM PT RETURN 9FT ADLT (ELECTROSURGICAL) ×5
ELECTRODE REM PT RTRN 9FT ADLT (ELECTROSURGICAL) ×3 IMPLANT
GAUZE PACK 2X3YD (GAUZE/BANDAGES/DRESSINGS) ×5 IMPLANT
GLOVE BIO SURGEON STRL SZ7.5 (GLOVE) ×10 IMPLANT
GOWN STRL REUS W/ TWL LRG LVL3 (GOWN DISPOSABLE) ×6 IMPLANT
GOWN STRL REUS W/TWL LRG LVL3 (GOWN DISPOSABLE) ×4
IV NS 500ML (IV SOLUTION) ×2
IV NS 500ML BAXH (IV SOLUTION) ×3 IMPLANT
KIT TURNOVER KIT A (KITS) ×5 IMPLANT
LABEL OR SOLS (LABEL) ×5 IMPLANT
NS IRRIG 500ML POUR BTL (IV SOLUTION) ×5 IMPLANT
PACK HEAD/NECK (MISCELLANEOUS) ×5 IMPLANT
PACKING NASAL EPIS 4X2.4 XEROG (MISCELLANEOUS) ×5 IMPLANT
PATTIES SURGICAL .5 X3 (DISPOSABLE) ×10 IMPLANT
SHAVER DIEGO BLD STD TYPE A (BLADE) ×5 IMPLANT
SOL ANTI-FOG 6CC FOG-OUT (MISCELLANEOUS) ×3 IMPLANT
SOL FOG-OUT ANTI-FOG 6CC (MISCELLANEOUS) ×2
SPLINT NASAL SEPTAL PRE-CUT (MISCELLANEOUS) ×2 IMPLANT
SUT CHROMIC 4 0 RB 1X27 (SUTURE) ×5 IMPLANT
SUT ETH BLK MONO 3 0 FS 1 12/B (SUTURE) ×5 IMPLANT
SUT ETHILON 3 0 PS 1 (SUTURE) ×5 IMPLANT
SYR 30ML LL (SYRINGE) ×5 IMPLANT
SYR 3ML LL SCALE MARK (SYRINGE) ×5 IMPLANT
TRACKER CRANIALMASK (MASK) ×5 IMPLANT
TUBING CONNECTING 10 (TUBING) ×4 IMPLANT
TUBING CONNECTING 10' (TUBING) ×1
TUBING DECLOG MULTIDEBRIDER (TUBING) ×5 IMPLANT
WATER STERILE IRR 1000ML POUR (IV SOLUTION) ×5 IMPLANT

## 2018-10-31 NOTE — Transfer of Care (Signed)
Immediate Anesthesia Transfer of Care Note  Patient: Caleb Miles  Procedure(s) Performed: IMAGE GUIDED SINUS SURGERY (N/A ) NASAL SEPTOPLASTY WITH Bilateral InferiorTURBINATE REDUCTION (Bilateral ) Right MAXILLARY ANTROSTOMY without tissue removal (Right )  Patient Location: PACU  Anesthesia Type:General  Level of Consciousness: awake  Airway & Oxygen Therapy: Patient Spontanous Breathing and Patient connected to face mask oxygen  Post-op Assessment: Report given to RN and Post -op Vital signs reviewed and stable  Post vital signs: Reviewed and stable  Last Vitals:  Vitals Value Taken Time  BP 143/98 10/31/2018 10:59 AM  Temp 36.1 C 10/31/2018 10:59 AM  Pulse 88 10/31/2018 11:02 AM  Resp 11 10/31/2018 11:02 AM  SpO2 100 % 10/31/2018 11:02 AM  Vitals shown include unvalidated device data.  Last Pain:  Vitals:   10/31/18 1059  TempSrc:   PainSc: Asleep         Complications: No apparent anesthesia complications

## 2018-10-31 NOTE — Anesthesia Preprocedure Evaluation (Signed)
Anesthesia Evaluation  Patient identified by MRN, date of birth, ID band Patient awake    Reviewed: Allergy & Precautions, NPO status , Patient's Chart, lab work & pertinent test results  History of Anesthesia Complications Negative for: history of anesthetic complications  Airway Mallampati: II  TM Distance: >3 FB Neck ROM: Full    Dental no notable dental hx.    Pulmonary sleep apnea (just diagnosed, doesn't have CPAP yet) , neg COPD,    breath sounds clear to auscultation- rhonchi (-) wheezing      Cardiovascular Exercise Tolerance: Good (-) hypertension(-) CAD, (-) Past MI, (-) Cardiac Stents and (-) CABG  Rhythm:Regular Rate:Normal - Systolic murmurs and - Diastolic murmurs    Neuro/Psych  Headaches, PSYCHIATRIC DISORDERS Anxiety Depression    GI/Hepatic negative GI ROS, Neg liver ROS,   Endo/Other  neg diabetesHypothyroidism   Renal/GU negative Renal ROS     Musculoskeletal negative musculoskeletal ROS (+)   Abdominal (+) + obese,   Peds  Hematology negative hematology ROS (+)   Anesthesia Other Findings Past Medical History: No date: ADHD (attention deficit hyperactivity disorder) No date: Anxiety No date: Depression No date: Headache     Comment:  migraines No date: History of kidney stones     Comment:  h/o No date: Hypothyroidism No date: Sleep apnea     Comment:  JUST RECENTLY DX AND HAS NOT SET UP TO GET CPAP YET   Reproductive/Obstetrics                             Anesthesia Physical Anesthesia Plan  ASA: II  Anesthesia Plan: General   Post-op Pain Management:    Induction: Intravenous  PONV Risk Score and Plan: 1 and Ondansetron and Midazolam  Airway Management Planned: Oral ETT  Additional Equipment:   Intra-op Plan:   Post-operative Plan: Extubation in OR  Informed Consent: I have reviewed the patients History and Physical, chart, labs and discussed  the procedure including the risks, benefits and alternatives for the proposed anesthesia with the patient or authorized representative who has indicated his/her understanding and acceptance.     Dental advisory given  Plan Discussed with: CRNA and Anesthesiologist  Anesthesia Plan Comments:         Anesthesia Quick Evaluation

## 2018-10-31 NOTE — Anesthesia Post-op Follow-up Note (Signed)
Anesthesia QCDR form completed.        

## 2018-10-31 NOTE — Op Note (Signed)
..10/31/2018  10:44 AM    Evlyn Kanner  [id number]    Pre-Op Dx:  Deviated Nasal Septum, Hypertrophic Inferior Turbinates, right chronic maxillary sinusitis  Post-op Dx: Same  Proc:   1)  Right maxillary antrostomy  2)  Nasal Septoplasty  3)  Bilateral Partial Reduction Inferior Turbinates   4)  Image Guided Sinus Surgery  Surg:  Roney Mans Mandela Bello  Anes:  GOT  EBL:  31ml  Comp:  none  Findings: severe anterior left septal deviation impaction on inferior turbinate, right more posterior and superior deviation.  Large soft tissue and bone inferior turbinates reduced.  Right maxillary antrostomy opened and irrigated.  Procedure: With the patient in a comfortable supine position,  general orotracheal anesthesia was induced without difficulty.  The patient received preoperative Afrin spray for topical decongestion and vasoconstriction.  At an appropriate level, the patient was placed in a semi-sitting position.  Nasal vibrissae were trimmed.   1% Xylocaine with 1:100,000 epinephrine, 4.5 cc's, was infiltrated into the anterior floor of the nose, into the nasal spine region, into the membranous columella, and finally into the submucoperichondrial plane of the septum on both sides.  Several minutes were allowed for this to take effect.  Cottoniod pledgetts soaked in Afrin were placed into both nasal cavities and left while the patient was prepped and draped in the standard fashion.   A proper time-out was performed.  The Stryker image guidance system was set up and calibrated in the normal fashion with an acceptable error of 0.12mm.   The materials were removed from the nose and observed to be intact and correct in number.  The nose was inspected with a headlight and zero degree endoscope with the findings as described above.  A left Killian incision was sharply executed and carried down to the caudal edge of the quadrangular cartilage with a 15 blade scapel.  A mucoperichondrial flap  was elelvated along the quadrangular plate back to the bony-cartilaginous junction using caudal elevator and freer elevator. The mucoperiostium was then elevated along the ethmoid plate and the vomer. An itracartilagenous incision was made using the freer elevator and a contralateral mucoperichondiral flap was elevated using a freer elevator.  Care was taken to avoid any large rents or opposing rents in the mucoperichondrial flap.  Boney spurs of the vomer and maxillary crest were removed with Takahashi forceps.  The area of cartilagenous deviation was removed with combination of freer elevator and Takahashi forceps creating a widely patent nasal cavity as well as resolution of obstruction from the cartilagenous deviation. The mucosal flaps were placed back into their anatomic position to allow visualization of the airways. The septum now sat in the midline with an improved airway.  A 4-0 Chromic was used to close the Fairview incision as well.   The inferior turbinates were then inspected.  Under endoscopic visualization, the inferior turbinates were infractured bilaterally with a Therapist, nutritional.  A kelly clamp was attached to the anterior-inferior third of each inferior turbinate for approximately one minute.  Under endoscopic visualization, Tru-cutting forceps were used to remove the anterior-inferior third of each inferior turbinate.  Electrocautery was used to control bleeding in the area. The remaining turbinate was then outfractured to open up the airway further. There was no significant bleeding noted. The right turbinate was then trimmed and outfractured in a similar fashion.  The airways were then visualized and showed open passageways on both sides that were significantly improved compared to before surgery.  At this point, attention was directed to the functional endoscopic sinus surgery aspect of the procedure.  The nose was next inspected with a zero degree endoscope and the right middle  turbinate was medialized and afrin soaked pledgets were placed lateral to the turbinate for approximately one minute.  The right uncinate process was infractured with a maxillary ostia seeker.   Using backbiting forceps, the uncinate was removed revealing a natural ostia of the maxillary sinus.  A ball tipped probe was placed through the natural ostia and this was used to create a larger opening.  Using a Diego microdebrider, the maxillary antrostomy was enlarged for a widely patent maxillary antrostomy.  Hemostasis was performed with topical Afrin soaked pledgets.  Visualization with a zero degree endoscope was used to examine the bilateral maxillary antrostomies which were noted to be widely patent and in continuity with the natural os.  There was no signifcant bleeding. Nasal splints were applied to both sides of the septum using Xomed 0.79mm regular sized splints that were trimmed, and then held in position with a 3-0 Nylon through and through suture.  Stamberger sinufoam was placed along the cut edge of the inferior turbinates bilaterally.  Xerogel was placed lateral to the middle turbinate on the patient's right side.  The patient was turned back over to anesthesia, and awakened, extubated, and taken to the PACU in satisfactory condition.  Dispo:   PACU to home  Plan: Ice, elevation, narcotic analgesia, and prophylactic antibiotics for the duration of indwelling nasal foreign bodies.  We will reevaluate the patient in the office in 7 days and remove the septal splints.  Return to work in 7 to 10 days, strenuous activities in two weeks.   Roney Mans R[place] 10/31/2018 10:44 AM

## 2018-10-31 NOTE — Anesthesia Postprocedure Evaluation (Signed)
Anesthesia Post Note  Patient: Caleb Miles  Procedure(s) Performed: IMAGE GUIDED SINUS SURGERY (N/A ) NASAL SEPTOPLASTY WITH Bilateral InferiorTURBINATE REDUCTION (Bilateral ) Right MAXILLARY ANTROSTOMY without tissue removal (Right )  Patient location during evaluation: PACU Anesthesia Type: General Level of consciousness: awake and alert and oriented Pain management: pain level controlled Vital Signs Assessment: post-procedure vital signs reviewed and stable Respiratory status: spontaneous breathing, nonlabored ventilation and respiratory function stable Cardiovascular status: blood pressure returned to baseline and stable Postop Assessment: no signs of nausea or vomiting Anesthetic complications: no     Last Vitals:  Vitals:   10/31/18 1204 10/31/18 1241  BP: (!) 147/92 136/88  Pulse: 72 77  Resp: 14 14  Temp:    SpO2: 98% 98%    Last Pain:  Vitals:   10/31/18 1241  TempSrc:   PainSc: 0-No pain                 Cadence Haslam

## 2018-10-31 NOTE — H&P (Signed)
..  History and Physical paper copy reviewed and updated date of procedure and will be scanned into system.  Patient seen and examined.  

## 2018-10-31 NOTE — Discharge Instructions (Signed)
General Anesthesia, Adult, Care After  This sheet gives you information about how to care for yourself after your procedure. Your health care provider may also give you more specific instructions. If you have problems or questions, contact your health care provider.  What can I expect after the procedure?  After the procedure, the following side effects are common:  Pain or discomfort at the IV site.  Nausea.  Vomiting.  Sore throat.  Trouble concentrating.  Feeling cold or chills.  Weak or tired.  Sleepiness and fatigue.  Soreness and body aches. These side effects can affect parts of the body that were not involved in surgery.  Follow these instructions at home:    For at least 24 hours after the procedure:  Have a responsible adult stay with you. It is important to have someone help care for you until you are awake and alert.  Rest as needed.  Do not:  Participate in activities in which you could fall or become injured.  Drive.  Use heavy machinery.  Drink alcohol.  Take sleeping pills or medicines that cause drowsiness.  Make important decisions or sign legal documents.  Take care of children on your own.  Eating and drinking  Follow any instructions from your health care provider about eating or drinking restrictions.  When you feel hungry, start by eating small amounts of foods that are soft and easy to digest (bland), such as toast. Gradually return to your regular diet.  Drink enough fluid to keep your urine pale yellow.  If you vomit, rehydrate by drinking water, juice, or clear broth.  General instructions  If you have sleep apnea, surgery and certain medicines can increase your risk for breathing problems. Follow instructions from your health care provider about wearing your sleep device:  Anytime you are sleeping, including during daytime naps.  While taking prescription pain medicines, sleeping medicines, or medicines that make you drowsy.  Return to your normal activities as told by your health care  provider. Ask your health care provider what activities are safe for you.  Take over-the-counter and prescription medicines only as told by your health care provider.  If you smoke, do not smoke without supervision.  Keep all follow-up visits as told by your health care provider. This is important.  Contact a health care provider if:  You have nausea or vomiting that does not get better with medicine.  You cannot eat or drink without vomiting.  You have pain that does not get better with medicine.  You are unable to pass urine.  You develop a skin rash.  You have a fever.  You have redness around your IV site that gets worse.  Get help right away if:  You have difficulty breathing.  You have chest pain.  You have blood in your urine or stool, or you vomit blood.  Summary  After the procedure, it is common to have a sore throat or nausea. It is also common to feel tired.  Have a responsible adult stay with you for the first 24 hours after general anesthesia. It is important to have someone help care for you until you are awake and alert.  When you feel hungry, start by eating small amounts of foods that are soft and easy to digest (bland), such as toast. Gradually return to your regular diet.  Drink enough fluid to keep your urine pale yellow.  Return to your normal activities as told by your health care provider. Ask your health care   provider what activities are safe for you.  This information is not intended to replace advice given to you by your health care provider. Make sure you discuss any questions you have with your health care provider.  Document Released: 11/20/2000 Document Revised: 03/30/2017 Document Reviewed: 03/30/2017  Elsevier Interactive Patient Education  2019 Elsevier Inc[employer]

## 2018-10-31 NOTE — Anesthesia Procedure Notes (Signed)
Procedure Name: Intubation Date/Time: 10/31/2018 9:13 AM Performed by: Rudean Hitt, CRNA Pre-anesthesia Checklist: Patient identified, Patient being monitored, Timeout performed, Emergency Drugs available and Suction available Patient Re-evaluated:Patient Re-evaluated prior to induction Oxygen Delivery Method: Circle system utilized Preoxygenation: Pre-oxygenation with 100% oxygen Induction Type: IV induction Ventilation: Mask ventilation without difficulty Laryngoscope Size: Mac, 3 and 4 Grade View: Grade II Tube type: Oral Tube size: 7.0 mm Number of attempts: 1 Airway Equipment and Method: Stylet Placement Confirmation: ETT inserted through vocal cords under direct vision,  positive ETCO2 and breath sounds checked- equal and bilateral Secured at: 22 cm Tube secured with: Tape Dental Injury: Teeth and Oropharynx as per pre-operative assessment

## 2018-11-21 DIAGNOSIS — F900 Attention-deficit hyperactivity disorder, predominantly inattentive type: Secondary | ICD-10-CM | POA: Diagnosis not present

## 2018-11-21 DIAGNOSIS — F3342 Major depressive disorder, recurrent, in full remission: Secondary | ICD-10-CM | POA: Diagnosis not present

## 2019-04-07 ENCOUNTER — Other Ambulatory Visit: Payer: Self-pay

## 2019-04-07 ENCOUNTER — Encounter: Payer: Self-pay | Admitting: Physician Assistant

## 2019-04-07 ENCOUNTER — Ambulatory Visit (INDEPENDENT_AMBULATORY_CARE_PROVIDER_SITE_OTHER): Payer: BC Managed Care – PPO | Admitting: Physician Assistant

## 2019-04-07 DIAGNOSIS — F5101 Primary insomnia: Secondary | ICD-10-CM

## 2019-04-07 MED ORDER — RAMELTEON 8 MG PO TABS: 8.0000 mg | ORAL_TABLET | Freq: Every day | ORAL | 0 refills | Status: DC

## 2019-04-07 NOTE — Patient Instructions (Signed)
Ramelteon tablets What is this medicine? RAMELTEON (ram EL tee on) is used to treat insomnia. This medicine helps you to fall asleep. This medicine may be used for other purposes; ask your health care provider or pharmacist if you have questions. COMMON BRAND NAME(S): Rozerem What should I tell my health care provider before I take this medicine? They need to know if you have any of these conditions:  depression  history of a drug or alcohol abuse problem  liver disease  lung or breathing disease  suicidal thoughts  an unusual or allergic reaction to ramelteon, other medicines, foods, dyes, or preservatives  pregnant or trying to get pregnant  breast-feeding How should I use this medicine? Take this medicine by mouth with a glass of water. Do not break tablets; swallow whole. Follow the directions on the prescription label. It is better to take this medicine on an empty stomach and only when you are ready for bed. Do not take your medicine more often than directed. A special MedGuide will be given to you by the pharmacist with each prescription and refill. Be sure to read this information carefully each time. Talk to your pediatrician regarding the use of this medicine in children. Special care may be needed. Overdosage: If you think you have taken too much of this medicine contact a poison control center or emergency room at once. NOTE: This medicine is only for you. Do not share this medicine with others. What if I miss a dose? This does not apply. This medicine should only be taken immediately before going to sleep. Do not take double or extra doses. What may interact with this medicine? Do not take this medicine with any of the following medications:  fluvoxamine  melatonin This medicine may also interact with the following medications:  medicines used to treat fungal infections like ketoconazole, fluconazole, or itraconazole  rifampin This list may not describe all  possible interactions. Give your health care provider a list of all the medicines, herbs, non-prescription drugs, or dietary supplements you use. Also tell them if you smoke, drink alcohol, or use illegal drugs. Some items may interact with your medicine. What should I watch for while using this medicine? Visit your doctor or health care professional for regular checks on your progress. Keep a regular sleep schedule by going to bed at about the same time each night. Avoid caffeine-containing drinks in the evening hours. Talk to your doctor if you still have trouble sleeping within 7 to 10 days of using this medicine. This may mean there is another cause for your sleep problems. After taking this medicine, you may get up out of bed and do an activity that you do not know you are doing. The next morning, you may have no memory of this. Activities include driving a car ("sleep-driving"), making and eating food, talking on the phone, sexual activity, and sleep-walking. Serious injuries have occurred. Call your doctor right away if you find out you have done any of these activities. Do not take this medicine if you have used alcohol that evening. Do not take it if you have taken another medicine for sleep. The risk of doing these sleep-related activities is higher. Do not take this medicine unless you are able to stay in bed for a full night (7 to 8 hours) before you must be active again. You may have a decrease in mental alertness the day after use, even if you feel that you are fully awake. Tell your doctor if   you will need to perform activities requiring full alertness, such as driving, the next day. Do not stand or sit up quickly after taking this medicine, especially if you are an older patient. This reduces the risk of dizzy or fainting spells. If you or your family notice any changes in your behavior, such as new or worsening depression, thoughts of harming yourself, anxiety, other unusual or disturbing  thoughts, or memory loss, call your doctor right away. What side effects may I notice from receiving this medicine? Side effects that you should report to your doctor or health care professional as soon as possible:  allergic reactions like skin rash, itching or hives, swelling of the face, lips, or tongue  breast milk production or discharge  breathing problems  joint or muscle pain  depression, suicidal thoughts  missed monthly period (for women)  unusual activities while asleep like driving, eating, making phone calls  unusually weak or tired  worsening of insomnia Side effects that usually do not require medical attention (report to your doctor or health care professional if they continue or are bothersome):  bad taste  daytime sleepiness  decreased sex drive  diarrhea  headache  nausea This list may not describe all possible side effects. Call your doctor for medical advice about side effects. You may report side effects to FDA at 1-800-FDA-1088. Where should I keep my medicine? Keep out of the reach of children. Store at room temperature between 15 and 30 degrees C (59 and 86 degrees F). Keep the container tightly closed. Protect from moisture and humidity. Throw away any unused medicine after the expiration date. NOTE: This sheet is a summary. It may not cover all possible information. If you have questions about this medicine, talk to your doctor, pharmacist, or health care provider.  2020 Elsevier/Gold Standard (2018-02-08 12:18:24)  

## 2019-04-07 NOTE — Progress Notes (Signed)
Patient: Caleb Miles Male    DOB: 03-28-1971   48 y.o.   MRN: [id number] Visit Date: 04/07/2019  Today's Provider: Mar Daring, PA-C   Chief Complaint  Patient presents with  . Insomnia   Subjective:    Virtual Visit via Video Note  I connected with Caleb Miles on 04/07/19 at  1:40 PM EDT by a video enabled telemedicine application and verified that I am speaking with the correct person using two identifiers.  Location: Patient: home Provider: [facility]   I discussed the limitations of evaluation and management by telemedicine and the availability of in person appointments. The patient expressed understanding and agreed to proceed.   HPI   Insomnia  He presents today for evaluation of insomnia. Onset was 4 weeks ago. Insomnia is getting unchanged.   He does not have difficulty FALLING asleep. He has difficulty STAYING asleep. He does not wake frequently to urinate. He does not have urge to move legs when resting. He is not having pain when trying to sleep  He has history of anxiety. He has history of depression.  He is not taking OTC sleeping aid. But takes Benadryl sometimes for his allergies. He is not taking medications to help sleep. He is not drinking alcohol to help sleep. He is not using illicit drugs. He has used Trazodone 150mg  in the past but reports it got to where it did not work as well.  -------------------------------------------------------------------- Patient had sleep study done 09/05/2018 at [facility] under Media tab.   No Known Allergies   Current Outpatient Medications:  .  levothyroxine (SYNTHROID, LEVOTHROID) 75 MCG tablet, Take 75 mcg by mouth daily before breakfast. , Disp: , Rfl: 12 .  cetirizine (ZYRTEC) 10 MG tablet, Take 10 mg by mouth daily., Disp: , Rfl:  .  JORNAY PM 100 MG CP24, 1 tablet every morning. , Disp: , Rfl: 0 .  ondansetron (ZOFRAN) 4 MG tablet, Take 1 tablet (4 mg total) by mouth every 8 (eight) hours  as needed for up to 10 doses for nausea or vomiting. (Patient not taking: Reported on 04/07/2019), Disp: 20 tablet, Rfl: 0 .  traZODone (DESYREL) 150 MG tablet, Take 150 mg by mouth at bedtime. , Disp: , Rfl:   Review of Systems  Constitutional: Negative.   HENT: Negative.   Respiratory: Negative.   Cardiovascular: Negative.   Neurological: Negative.   Psychiatric/Behavioral: Positive for sleep disturbance. Negative for dysphoric mood. The patient is not nervous/anxious.     Social History   Tobacco Use  . Smoking status: Never Smoker  . Smokeless tobacco: Never Used  Substance Use Topics  . Alcohol use: No      Objective:   There were no vitals taken for this visit. There were no vitals filed for this visit.   Physical Exam Vitals signs reviewed.  Constitutional:      General: He is not in acute distress.    Appearance: Normal appearance. He is well-developed. He is not ill-appearing.  HENT:     Head: Normocephalic and atraumatic.  Eyes:     Conjunctiva/sclera: Conjunctivae normal.  Neck:     Musculoskeletal: Normal range of motion and neck supple.  Pulmonary:     Effort: Pulmonary effort is normal. No respiratory distress.  Neurological:     Mental Status: He is alert.  Psychiatric:        Mood and Affect: Mood normal.        Behavior: Behavior normal.  Thought Content: Thought content normal.        Judgment: Judgment normal.      No results found for any visits on 04/07/19.     Assessment & Plan     1. Primary insomnia Failed trazodone in the past. Will try Rozerem as below. I will see him back in 2-4 weeks to see how working. If not improving, may consider Belsomra for the sleep maintenance offered.  - ramelteon (ROZEREM) 8 MG tablet; Take 1 tablet (8 mg total) by mouth at bedtime.  Dispense: 30 tablet; Refill: 0   I discussed the assessment and treatment plan with the patient. The patient was provided an opportunity to ask questions and all were  answered. The patient agreed with the plan and demonstrated an understanding of the instructions.   The patient was advised to call back or seek an in-person evaluation if the symptoms worsen or if the condition fails to improve as anticipated.  I provided 15 minutes of non-face-to-face time during this encounter.    Margaretann LovelessJennifer M Burnette, PA-C  [facility]

## 2019-05-06 ENCOUNTER — Telehealth: Payer: Self-pay

## 2019-05-06 NOTE — Telephone Encounter (Signed)
Pt states he was in contact with a Co-worker who tested positive for COVID -19.  He does not have any symptoms right now but would like to be tested.    Contact number: [phone]  Thanks,   -[name]

## 2019-05-06 NOTE — Telephone Encounter (Signed)
LMOVm for pt to return call.  

## 2019-05-06 NOTE — Telephone Encounter (Signed)
He needs to isolate. He can go to [facility] testing site at his convenience tomorrow between 8a-330p. He has to isolate until results received as long as he stays asymptomatic.

## 2019-05-07 ENCOUNTER — Other Ambulatory Visit: Payer: Self-pay

## 2019-05-07 DIAGNOSIS — Z20822 Contact with and (suspected) exposure to covid-19: Secondary | ICD-10-CM

## 2019-05-07 DIAGNOSIS — R6889 Other general symptoms and signs: Secondary | ICD-10-CM | POA: Diagnosis not present

## 2019-05-07 NOTE — Telephone Encounter (Signed)
Patient advised.

## 2019-05-08 LAB — NOVEL CORONAVIRUS, NAA: SARS-CoV-2, NAA: NOT DETECTED

## 2019-05-20 DIAGNOSIS — J301 Allergic rhinitis due to pollen: Secondary | ICD-10-CM | POA: Diagnosis not present

## 2019-05-20 DIAGNOSIS — J342 Deviated nasal septum: Secondary | ICD-10-CM | POA: Diagnosis not present

## 2019-06-03 DIAGNOSIS — J301 Allergic rhinitis due to pollen: Secondary | ICD-10-CM | POA: Diagnosis not present

## 2019-06-20 DIAGNOSIS — F331 Major depressive disorder, recurrent, moderate: Secondary | ICD-10-CM | POA: Diagnosis not present

## 2019-06-20 DIAGNOSIS — F900 Attention-deficit hyperactivity disorder, predominantly inattentive type: Secondary | ICD-10-CM | POA: Diagnosis not present

## 2019-06-25 DIAGNOSIS — Z713 Dietary counseling and surveillance: Secondary | ICD-10-CM | POA: Diagnosis not present

## 2019-09-09 ENCOUNTER — Ambulatory Visit: Payer: BC Managed Care – PPO | Attending: Internal Medicine

## 2019-09-09 DIAGNOSIS — Z20822 Contact with and (suspected) exposure to covid-19: Secondary | ICD-10-CM

## 2019-09-10 LAB — NOVEL CORONAVIRUS, NAA: SARS-CoV-2, NAA: NOT DETECTED

## 2019-09-11 DIAGNOSIS — F4322 Adjustment disorder with anxiety: Secondary | ICD-10-CM | POA: Diagnosis not present

## 2019-09-12 ENCOUNTER — Ambulatory Visit (INDEPENDENT_AMBULATORY_CARE_PROVIDER_SITE_OTHER): Payer: BC Managed Care – PPO | Admitting: Physician Assistant

## 2019-09-12 ENCOUNTER — Encounter: Payer: Self-pay | Admitting: Physician Assistant

## 2019-09-12 DIAGNOSIS — J0140 Acute pansinusitis, unspecified: Secondary | ICD-10-CM

## 2019-09-12 MED ORDER — AMOXICILLIN-POT CLAVULANATE 875-125 MG PO TABS: 1.0000 | ORAL_TABLET | Freq: Two times a day (BID) | ORAL | 0 refills | Status: DC

## 2019-09-12 NOTE — Progress Notes (Signed)
Patient: Caleb Miles Male    DOB: Nov 26, 1970   49 y.o.   MRN: [id number] Visit Date: 09/12/2019  Today's Provider: Mar Daring, PA-C   Chief Complaint  Patient presents with  . Sinusitis   Subjective:    Virtual Visit via Telephone Note  I connected with Caleb Miles on 09/12/19 at  5:20 PM EST by telephone and verified that I am speaking with the correct person using two identifiers.  Location: Patient: Home Provider: [facility]   I discussed the limitations, risks, security and privacy concerns of performing an evaluation and management service by telephone and the availability of in person appointments. I also discussed with the patient that there may be a patient responsible charge related to this service. The patient expressed understanding and agreed to proceed.  Sinusitis This is a new problem. The current episode started 1 to 4 weeks ago. The problem is unchanged. The maximum temperature recorded prior to his arrival was 100.4 - 100.9 F. The fever has been present for 1 to 2 days. The pain is mild. Associated symptoms include congestion, coughing, ear pain, headaches, sinus pressure and a sore throat. Past treatments include oral decongestants. The treatment provided no relief.  Covid-19 testing done 3 days ago was negative.  No Known Allergies   Current Outpatient Medications:  .  amoxicillin-clavulanate (AUGMENTIN) 875-125 MG tablet, Take 1 tablet by mouth 2 (two) times daily., Disp: 20 tablet, Rfl: 0 .  buPROPion (WELLBUTRIN XL) 150 MG 24 hr tablet, TAKE 1 TABLET EVERY MORNING FOR 7 DAYS THEN 2 TABLETS FOR 7 DAYS THEN 3 TABLETS THEREAFTER, Disp: , Rfl:  .  cetirizine (ZYRTEC) 10 MG tablet, Take 10 mg by mouth daily., Disp: , Rfl:  .  levothyroxine (SYNTHROID, LEVOTHROID) 75 MCG tablet, Take 75 mcg by mouth daily before breakfast. , Disp: , Rfl: 12  Review of Systems  Constitutional: Negative.   HENT: Positive for congestion, ear pain, sinus pressure and  sore throat.   Eyes: Negative.   Respiratory: Positive for cough.   Cardiovascular: Negative.   Gastrointestinal: Negative.   Endocrine: Negative.   Genitourinary: Negative.   Musculoskeletal: Negative.   Skin: Negative.   Allergic/Immunologic: Negative.   Neurological: Positive for headaches.  Hematological: Negative.   Psychiatric/Behavioral: Negative.     Social History   Tobacco Use  . Smoking status: Never Smoker  . Smokeless tobacco: Never Used  Substance Use Topics  . Alcohol use: No      Objective:   There were no vitals taken for this visit. There were no vitals filed for this visit.There is no height or weight on file to calculate BMI.   Physical Exam Vitals reviewed.  Constitutional:      General: He is not in acute distress. HENT:     Head:     Comments: Patient reports maxillary sinus tenderness and tooth sensitivity Pulmonary:     Effort: No respiratory distress.  Neurological:     Mental Status: He is alert.      No results found for any visits on 09/12/19.     Assessment & Plan    1. Acute non-recurrent pansinusitis Worsening symptoms that have not responded to OTC medications. Will give augmentin as below. Continue allergy medications. Stay well hydrated and get plenty of rest. Call if no symptom improvement or if symptoms worsen. - amoxicillin-clavulanate (AUGMENTIN) 875-125 MG tablet; Take 1 tablet by mouth 2 (two) times daily.  Dispense: 20 tablet;  Refill: 0    I discussed the assessment and treatment plan with the patient. The patient was provided an opportunity to ask questions and all were answered. The patient agreed with the plan and demonstrated an understanding of the instructions.   The patient was advised to call back or seek an in-person evaluation if the symptoms worsen or if the condition fails to improve as anticipated.  I provided 10 minutes of non-face-to-face time during this encounter.   Margaretann Loveless, PA-C    [facility]

## 2019-09-12 NOTE — Telephone Encounter (Signed)
Patient scheduled for 520 today

## 2019-09-12 NOTE — Patient Instructions (Signed)
Sinusitis, Adult Sinusitis is inflammation of your sinuses. Sinuses are hollow spaces in the bones around your face. Your sinuses are located:  Around your eyes.  In the middle of your forehead.  Behind your nose.  In your cheekbones. Mucus normally drains out of your sinuses. When your nasal tissues become inflamed or swollen, mucus can become trapped or blocked. This allows bacteria, viruses, and fungi to grow, which leads to infection. Most infections of the sinuses are caused by a virus. Sinusitis can develop quickly. It can last for up to 4 weeks (acute) or for more than 12 weeks (chronic). Sinusitis often develops after a cold. What are the causes? This condition is caused by anything that creates swelling in the sinuses or stops mucus from draining. This includes:  Allergies.  Asthma.  Infection from bacteria or viruses.  Deformities or blockages in your nose or sinuses.  Abnormal growths in the nose (nasal polyps).  Pollutants, such as chemicals or irritants in the air.  Infection from fungi (rare). What increases the risk? You are more likely to develop this condition if you:  Have a weak body defense system (immune system).  Do a lot of swimming or diving.  Overuse nasal sprays.  Smoke. What are the signs or symptoms? The main symptoms of this condition are pain and a feeling of pressure around the affected sinuses. Other symptoms include:  Stuffy nose or congestion.  Thick drainage from your nose.  Swelling and warmth over the affected sinuses.  Headache.  Upper toothache.  A cough that may get worse at night.  Extra mucus that collects in the throat or the back of the nose (postnasal drip).  Decreased sense of smell and taste.  Fatigue.  A fever.  Sore throat.  Bad breath. How is this diagnosed? This condition is diagnosed based on:  Your symptoms.  Your medical history.  A physical exam.  Tests to find out if your condition is  acute or chronic. This may include: ? Checking your nose for nasal polyps. ? Viewing your sinuses using a device that has a light (endoscope). ? Testing for allergies or bacteria. ? Imaging tests, such as an MRI or CT scan. In rare cases, a bone biopsy may be done to rule out more serious types of fungal sinus disease. How is this treated? Treatment for sinusitis depends on the cause and whether your condition is chronic or acute.  If caused by a virus, your symptoms should go away on their own within 10 days. You may be given medicines to relieve symptoms. They include: ? Medicines that shrink swollen nasal passages (topical intranasal decongestants). ? Medicines that treat allergies (antihistamines). ? A spray that eases inflammation of the nostrils (topical intranasal corticosteroids). ? Rinses that help get rid of thick mucus in your nose (nasal saline washes).  If caused by bacteria, your health care provider may recommend waiting to see if your symptoms improve. Most bacterial infections will get better without antibiotic medicine. You may be given antibiotics if you have: ? A severe infection. ? A weak immune system.  If caused by narrow nasal passages or nasal polyps, you may need to have surgery. Follow these instructions at home: Medicines  Take, use, or apply over-the-counter and prescription medicines only as told by your health care provider. These may include nasal sprays.  If you were prescribed an antibiotic medicine, take it as told by your health care provider. Do not stop taking the antibiotic even if you start   to feel better. Hydrate and humidify   Drink enough fluid to keep your urine pale yellow. Staying hydrated will help to thin your mucus.  Use a cool mist humidifier to keep the humidity level in your home above 50%.  Inhale steam for 10-15 minutes, 3-4 times a day, or as told by your health care provider. You can do this in the bathroom while a hot shower is  running.  Limit your exposure to cool or dry air. Rest  Rest as much as possible.  Sleep with your head raised (elevated).  Make sure you get enough sleep each night. General instructions   Apply a warm, moist washcloth to your face 3-4 times a day or as told by your health care provider. This will help with discomfort.  Wash your hands often with soap and water to reduce your exposure to germs. If soap and water are not available, use hand sanitizer.  Do not smoke. Avoid being around people who are smoking (secondhand smoke).  Keep all follow-up visits as told by your health care provider. This is important. Contact a health care provider if:  You have a fever.  Your symptoms get worse.  Your symptoms do not improve within 10 days. Get help right away if:  You have a severe headache.  You have persistent vomiting.  You have severe pain or swelling around your face or eyes.  You have vision problems.  You develop confusion.  Your neck is stiff.  You have trouble breathing. Summary  Sinusitis is soreness and inflammation of your sinuses. Sinuses are hollow spaces in the bones around your face.  This condition is caused by nasal tissues that become inflamed or swollen. The swelling traps or blocks the flow of mucus. This allows bacteria, viruses, and fungi to grow, which leads to infection.  If you were prescribed an antibiotic medicine, take it as told by your health care provider. Do not stop taking the antibiotic even if you start to feel better.  Keep all follow-up visits as told by your health care provider. This is important. This information is not intended to replace advice given to you by your health care provider. Make sure you discuss any questions you have with your health care provider. Document Revised: 01/14/2018 Document Reviewed: 01/14/2018 Elsevier Patient Education  2020 [employer] Inc[employer]  

## 2019-09-24 DIAGNOSIS — F4322 Adjustment disorder with anxiety: Secondary | ICD-10-CM | POA: Diagnosis not present

## 2019-10-02 DIAGNOSIS — F4322 Adjustment disorder with anxiety: Secondary | ICD-10-CM | POA: Diagnosis not present

## 2019-10-09 DIAGNOSIS — F4322 Adjustment disorder with anxiety: Secondary | ICD-10-CM | POA: Diagnosis not present

## 2019-10-17 DIAGNOSIS — F4322 Adjustment disorder with anxiety: Secondary | ICD-10-CM | POA: Diagnosis not present

## 2019-10-22 DIAGNOSIS — F4322 Adjustment disorder with anxiety: Secondary | ICD-10-CM | POA: Diagnosis not present

## 2019-10-30 DIAGNOSIS — F4322 Adjustment disorder with anxiety: Secondary | ICD-10-CM | POA: Diagnosis not present

## 2019-11-06 DIAGNOSIS — F4322 Adjustment disorder with anxiety: Secondary | ICD-10-CM | POA: Diagnosis not present

## 2019-11-10 DIAGNOSIS — F4322 Adjustment disorder with anxiety: Secondary | ICD-10-CM | POA: Diagnosis not present

## 2019-11-14 DIAGNOSIS — F4322 Adjustment disorder with anxiety: Secondary | ICD-10-CM | POA: Diagnosis not present

## 2020-01-01 DIAGNOSIS — F4322 Adjustment disorder with anxiety: Secondary | ICD-10-CM | POA: Diagnosis not present

## 2020-01-23 ENCOUNTER — Encounter: Payer: Self-pay | Admitting: Physician Assistant

## 2020-01-23 ENCOUNTER — Telehealth (INDEPENDENT_AMBULATORY_CARE_PROVIDER_SITE_OTHER): Payer: BC Managed Care – PPO | Admitting: Physician Assistant

## 2020-01-23 DIAGNOSIS — J0140 Acute pansinusitis, unspecified: Secondary | ICD-10-CM | POA: Diagnosis not present

## 2020-01-23 MED ORDER — AMOXICILLIN-POT CLAVULANATE 875-125 MG PO TABS: 1.0000 | ORAL_TABLET | Freq: Two times a day (BID) | ORAL | 0 refills | Status: DC

## 2020-01-23 NOTE — Patient Instructions (Signed)
Sinusitis, Adult Sinusitis is inflammation of your sinuses. Sinuses are hollow spaces in the bones around your face. Your sinuses are located:  Around your eyes.  In the middle of your forehead.  Behind your nose.  In your cheekbones. Mucus normally drains out of your sinuses. When your nasal tissues become inflamed or swollen, mucus can become trapped or blocked. This allows bacteria, viruses, and fungi to grow, which leads to infection. Most infections of the sinuses are caused by a virus. Sinusitis can develop quickly. It can last for up to 4 weeks (acute) or for more than 12 weeks (chronic). Sinusitis often develops after a cold. What are the causes? This condition is caused by anything that creates swelling in the sinuses or stops mucus from draining. This includes:  Allergies.  Asthma.  Infection from bacteria or viruses.  Deformities or blockages in your nose or sinuses.  Abnormal growths in the nose (nasal polyps).  Pollutants, such as chemicals or irritants in the air.  Infection from fungi (rare). What increases the risk? You are more likely to develop this condition if you:  Have a weak body defense system (immune system).  Do a lot of swimming or diving.  Overuse nasal sprays.  Smoke. What are the signs or symptoms? The main symptoms of this condition are pain and a feeling of pressure around the affected sinuses. Other symptoms include:  Stuffy nose or congestion.  Thick drainage from your nose.  Swelling and warmth over the affected sinuses.  Headache.  Upper toothache.  A cough that may get worse at night.  Extra mucus that collects in the throat or the back of the nose (postnasal drip).  Decreased sense of smell and taste.  Fatigue.  A fever.  Sore throat.  Bad breath. How is this diagnosed? This condition is diagnosed based on:  Your symptoms.  Your medical history.  A physical exam.  Tests to find out if your condition is  acute or chronic. This may include: ? Checking your nose for nasal polyps. ? Viewing your sinuses using a device that has a light (endoscope). ? Testing for allergies or bacteria. ? Imaging tests, such as an MRI or CT scan. In rare cases, a bone biopsy may be done to rule out more serious types of fungal sinus disease. How is this treated? Treatment for sinusitis depends on the cause and whether your condition is chronic or acute.  If caused by a virus, your symptoms should go away on their own within 10 days. You may be given medicines to relieve symptoms. They include: ? Medicines that shrink swollen nasal passages (topical intranasal decongestants). ? Medicines that treat allergies (antihistamines). ? A spray that eases inflammation of the nostrils (topical intranasal corticosteroids). ? Rinses that help get rid of thick mucus in your nose (nasal saline washes).  If caused by bacteria, your health care provider may recommend waiting to see if your symptoms improve. Most bacterial infections will get better without antibiotic medicine. You may be given antibiotics if you have: ? A severe infection. ? A weak immune system.  If caused by narrow nasal passages or nasal polyps, you may need to have surgery. Follow these instructions at home: Medicines  Take, use, or apply over-the-counter and prescription medicines only as told by your health care provider. These may include nasal sprays.  If you were prescribed an antibiotic medicine, take it as told by your health care provider. Do not stop taking the antibiotic even if you start   to feel better. Hydrate and humidify   Drink enough fluid to keep your urine pale yellow. Staying hydrated will help to thin your mucus.  Use a cool mist humidifier to keep the humidity level in your home above 50%.  Inhale steam for 10-15 minutes, 3-4 times a day, or as told by your health care provider. You can do this in the bathroom while a hot shower is  running.  Limit your exposure to cool or dry air. Rest  Rest as much as possible.  Sleep with your head raised (elevated).  Make sure you get enough sleep each night. General instructions   Apply a warm, moist washcloth to your face 3-4 times a day or as told by your health care provider. This will help with discomfort.  Wash your hands often with soap and water to reduce your exposure to germs. If soap and water are not available, use hand sanitizer.  Do not smoke. Avoid being around people who are smoking (secondhand smoke).  Keep all follow-up visits as told by your health care provider. This is important. Contact a health care provider if:  You have a fever.  Your symptoms get worse.  Your symptoms do not improve within 10 days. Get help right away if:  You have a severe headache.  You have persistent vomiting.  You have severe pain or swelling around your face or eyes.  You have vision problems.  You develop confusion.  Your neck is stiff.  You have trouble breathing. Summary  Sinusitis is soreness and inflammation of your sinuses. Sinuses are hollow spaces in the bones around your face.  This condition is caused by nasal tissues that become inflamed or swollen. The swelling traps or blocks the flow of mucus. This allows bacteria, viruses, and fungi to grow, which leads to infection.  If you were prescribed an antibiotic medicine, take it as told by your health care provider. Do not stop taking the antibiotic even if you start to feel better.  Keep all follow-up visits as told by your health care provider. This is important. This information is not intended to replace advice given to you by your health care provider. Make sure you discuss any questions you have with your health care provider. Document Revised: 01/14/2018 Document Reviewed: 01/14/2018 Elsevier Patient Education  2020 [employer] Inc[employer]  

## 2020-01-23 NOTE — Progress Notes (Signed)
MyChart Video Visit    Virtual Visit via Video Note   This visit type was conducted due to national recommendations for restrictions regarding the COVID-19 Pandemic (e.g. social distancing) in an effort to limit this patient's exposure and mitigate transmission in our community. This patient is at least at moderate risk for complications without adequate follow up. This format is felt to be most appropriate for this patient at this time. Physical exam was limited by quality of the video and audio technology used for the visit.   Interactive audio and video communications were attempted, although failed due to patient's inability to connect to video. Continued visit with audio only interaction with patient agreement.  Patient location: Home Provider location: [facility]   Patient: Caleb Miles   DOB: May 30, 1971   48 y.o. Male  MRN: [id number] Visit Date: 01/23/2020  Today's healthcare provider: Margaretann Loveless, PA-C   Chief Complaint  Patient presents with  . Sinusitis   Subjective    URI  This is a new problem. The current episode started in the past 7 days. The problem has been gradually worsening. There has been no fever. Associated symptoms include congestion, ear pain (Right ear pain), headaches, rhinorrhea and sinus pain. Pertinent negatives include no coughing, neck pain, plugged ear sensation, sore throat, swollen glands or wheezing. He has tried antihistamine and decongestant for the symptoms. The treatment provided mild relief.     Patient Active Problem List   Diagnosis Date Noted  . OSA (obstructive sleep apnea) 09/13/2018  . Family history of hemochromatosis 02/13/2017  . ADD (attention deficit disorder) 09/20/2015  . Migraine with aura 06/28/2015  . Depression, major, single episode 11/19/2014  . Allergic rhinitis 11/24/2009  . Anxiety disorder 06/01/2006   Past Medical History:  Diagnosis Date  . ADHD (attention deficit hyperactivity disorder)   . Anxiety    . Depression   . Headache    migraines  . History of kidney stones    h/o  . Hypothyroidism   . Sleep apnea    JUST RECENTLY DX AND HAS NOT SET UP TO GET CPAP YET   Social History   Tobacco Use  . Smoking status: Never Smoker  . Smokeless tobacco: Never Used  Substance Use Topics  . Alcohol use: No  . Drug use: No   No Known Allergies  Medications: Outpatient Medications Prior to Visit  Medication Sig  . buPROPion (WELLBUTRIN XL) 150 MG 24 hr tablet TAKE 1 TABLET EVERY MORNING FOR 7 DAYS THEN 2 TABLETS FOR 7 DAYS THEN 3 TABLETS THEREAFTER  . cetirizine (ZYRTEC) 10 MG tablet Take 10 mg by mouth daily.  Marland Kitchen levothyroxine (SYNTHROID, LEVOTHROID) 75 MCG tablet Take 75 mcg by mouth daily before breakfast.   . amoxicillin-clavulanate (AUGMENTIN) 875-125 MG tablet Take 1 tablet by mouth 2 (two) times daily.   No facility-administered medications prior to visit.    Review of Systems  Constitutional: Positive for fatigue. Negative for activity change, appetite change, chills, diaphoresis, fever and unexpected weight change.  HENT: Positive for congestion, ear pain (Right ear pain), postnasal drip, rhinorrhea, sinus pressure, sinus pain and trouble swallowing. Negative for hearing loss, sore throat and tinnitus.   Eyes: Negative.   Respiratory: Negative for apnea, cough, choking, chest tightness, shortness of breath, wheezing and stridor.   Gastrointestinal: Negative.   Musculoskeletal: Negative for neck pain and neck stiffness.  Neurological: Positive for headaches. Negative for dizziness and light-headedness.  Hematological: Negative for adenopathy.  Objective    There were no vitals taken for this visit.   Physical Exam Vitals reviewed.  Pulmonary:     Effort: No respiratory distress.  Neurological:     Mental Status: He is alert.        Assessment & Plan     1. Acute non-recurrent pansinusitis Worsening symptoms that have not responded to OTC medications.  Will give augmentin as below. Continue allergy medications. Stay well hydrated and get plenty of rest. Call if no symptom improvement or if symptoms worsen. - amoxicillin-clavulanate (AUGMENTIN) 875-125 MG tablet; Take 1 tablet by mouth 2 (two) times daily.  Dispense: 20 tablet; Refill: 0   No follow-ups on file.     I discussed the assessment and treatment plan with the patient. The patient was provided an opportunity to ask questions and all were answered. The patient agreed with the plan and demonstrated an understanding of the instructions.   The patient was advised to call back or seek an in-person evaluation if the symptoms worsen or if the condition fails to improve as anticipated.  I provided 7 minutes of non-face-to-face time during this encounter.  Reynolds Bowl, PA-C, have reviewed all documentation for this visit. The documentation on 01/23/20 for the exam, diagnosis, procedures, and orders are all accurate and complete.  [place] [facility] [phone] (phone) [phone] (fax)  [place]

## 2020-02-06 DIAGNOSIS — J301 Allergic rhinitis due to pollen: Secondary | ICD-10-CM | POA: Diagnosis not present

## 2020-02-16 DIAGNOSIS — J301 Allergic rhinitis due to pollen: Secondary | ICD-10-CM | POA: Diagnosis not present

## 2020-02-19 DIAGNOSIS — J301 Allergic rhinitis due to pollen: Secondary | ICD-10-CM | POA: Diagnosis not present

## 2020-02-23 DIAGNOSIS — J301 Allergic rhinitis due to pollen: Secondary | ICD-10-CM | POA: Diagnosis not present

## 2020-02-26 DIAGNOSIS — J301 Allergic rhinitis due to pollen: Secondary | ICD-10-CM | POA: Diagnosis not present

## 2020-03-04 DIAGNOSIS — J301 Allergic rhinitis due to pollen: Secondary | ICD-10-CM | POA: Diagnosis not present

## 2020-03-05 DIAGNOSIS — J301 Allergic rhinitis due to pollen: Secondary | ICD-10-CM | POA: Diagnosis not present

## 2020-03-08 DIAGNOSIS — J301 Allergic rhinitis due to pollen: Secondary | ICD-10-CM | POA: Diagnosis not present

## 2020-03-11 DIAGNOSIS — J301 Allergic rhinitis due to pollen: Secondary | ICD-10-CM | POA: Diagnosis not present

## 2020-03-15 DIAGNOSIS — J301 Allergic rhinitis due to pollen: Secondary | ICD-10-CM | POA: Diagnosis not present

## 2020-03-18 DIAGNOSIS — J301 Allergic rhinitis due to pollen: Secondary | ICD-10-CM | POA: Diagnosis not present

## 2020-03-22 DIAGNOSIS — J301 Allergic rhinitis due to pollen: Secondary | ICD-10-CM | POA: Diagnosis not present

## 2020-04-01 DIAGNOSIS — J301 Allergic rhinitis due to pollen: Secondary | ICD-10-CM | POA: Diagnosis not present

## 2020-04-08 DIAGNOSIS — J301 Allergic rhinitis due to pollen: Secondary | ICD-10-CM | POA: Diagnosis not present

## 2020-04-15 DIAGNOSIS — J301 Allergic rhinitis due to pollen: Secondary | ICD-10-CM | POA: Diagnosis not present

## 2020-04-21 ENCOUNTER — Other Ambulatory Visit: Payer: Self-pay | Admitting: Radiology

## 2020-04-21 ENCOUNTER — Other Ambulatory Visit: Payer: BC Managed Care – PPO

## 2020-04-21 DIAGNOSIS — Z20822 Contact with and (suspected) exposure to covid-19: Secondary | ICD-10-CM

## 2020-04-23 ENCOUNTER — Telehealth (INDEPENDENT_AMBULATORY_CARE_PROVIDER_SITE_OTHER): Payer: BC Managed Care – PPO | Admitting: Family Medicine

## 2020-04-23 DIAGNOSIS — J0140 Acute pansinusitis, unspecified: Secondary | ICD-10-CM

## 2020-04-23 LAB — NOVEL CORONAVIRUS, NAA: SARS-CoV-2, NAA: NOT DETECTED

## 2020-04-23 LAB — SARS-COV-2, NAA 2 DAY TAT

## 2020-04-23 MED ORDER — AMOXICILLIN-POT CLAVULANATE 875-125 MG PO TABS: 1.0000 | ORAL_TABLET | Freq: Two times a day (BID) | ORAL | 0 refills | Status: DC

## 2020-04-23 NOTE — Patient Instructions (Signed)
.   Please review the attached list of medications and notify my office if there are any errors.   . Please bring all of your medications to every appointment so we can make sure that our medication list is the same as yours.   

## 2020-04-23 NOTE — Progress Notes (Signed)
MyChart Video Visit   Virtual Visit via Video Note   This visit type was conducted due to national recommendations for restrictions regarding the COVID-19 Pandemic (e.g. social distancing) in an effort to limit this patient's exposure and mitigate transmission in our community. This patient is at least at moderate risk for complications without adequate follow up. This format is felt to be most appropriate for this patient at this time. Physical exam was limited by quality of the video and audio technology used for the visit.   Patient location: home  Provider location: [facility]   I discussed the limitations of evaluation and management by telemedicine and the availability of in person appointments. The patient expressed understanding and agreed to proceed.   Patient: Caleb Miles   DOB: 1971/07/03   49 y.o. Male  MRN: [id number] Visit Date: 04/23/2020  Today's healthcare provider: Mila Merry, MD   Chief Complaint  Patient presents with   Sinus Problem   Subjective    Sinus Problem This is a new problem. The current episode started 1 to 4 weeks ago. The problem has been gradually worsening since onset. There has been no fever. Associated symptoms include congestion, coughing, headaches, a hoarse voice, sinus pressure and a sore throat. Pertinent negatives include no chills, diaphoresis, ear pain, neck pain, shortness of breath, sneezing or swollen glands. Treatments tried: benadryl and mucinex. The treatment provided mild relief.    Using mucinex and benadryl. Doesn't nasal steroids well, but is using Neti-Pot    Medications: Outpatient Medications Prior to Visit  Medication Sig   amoxicillin-clavulanate (AUGMENTIN) 875-125 MG tablet Take 1 tablet by mouth 2 (two) times daily.   buPROPion (WELLBUTRIN XL) 150 MG 24 hr tablet TAKE 1 TABLET EVERY MORNING FOR 7 DAYS THEN 2 TABLETS FOR 7 DAYS THEN 3 TABLETS THEREAFTER   cetirizine (ZYRTEC) 10 MG tablet Take 10 mg by mouth  daily.   levothyroxine (SYNTHROID, LEVOTHROID) 75 MCG tablet Take 75 mcg by mouth daily before breakfast.    No facility-administered medications prior to visit.    Review of Systems  Constitutional: Negative for chills and diaphoresis.  HENT: Positive for congestion, hoarse voice, sinus pressure and sore throat. Negative for ear pain and sneezing.   Respiratory: Positive for cough. Negative for shortness of breath.   Musculoskeletal: Negative for neck pain.  Neurological: Positive for headaches.      Objective    There were no vitals taken for this visit.   Physical Exam   Awake, alert, oriented x 3. In no apparent distress  Assessment & Plan     1. Acute non-recurrent pansinusitis Typical infection for patient and usually responds well to - amoxicillin-clavulanate (AUGMENTIN) 875-125 MG tablet; Take 1 tablet by mouth 2 (two) times daily.  Dispense: 20 tablet; Refill: 0   Continue nasal saline flushes and allergy medications.  No follow-ups on file.     I discussed the assessment and treatment plan with the patient. The patient was provided an opportunity to ask questions and all were answered. The patient agreed with the plan and demonstrated an understanding of the instructions.   The patient was advised to call back or seek an in-person evaluation if the symptoms worsen or if the condition fails to improve as anticipated.  I provided 10 minutes of non-face-to-face time during this encounter.  The entirety of the information documented in the History of Present Illness, Review of Systems and Physical Exam were personally obtained by me. Portions of this  information were initially documented by the CMA and reviewed by me for thoroughness and accuracy.     Mila Merry, MD [facility] [phone] (phone) [phone] (fax)  [facility]

## 2020-04-26 DIAGNOSIS — J301 Allergic rhinitis due to pollen: Secondary | ICD-10-CM | POA: Diagnosis not present

## 2020-04-28 ENCOUNTER — Encounter: Payer: BC Managed Care – PPO | Admitting: Family Medicine

## 2020-04-28 NOTE — Progress Notes (Deleted)
    MyChart Video Visit    Virtual Visit via Video Note   This visit type was conducted due to national recommendations for restrictions regarding the COVID-19 Pandemic (e.g. social distancing) in an effort to limit this patient's exposure and mitigate transmission in our community. This patient is at least at moderate risk for complications without adequate follow up. This format is felt to be most appropriate for this patient at this time. Physical exam was limited by quality of the video and audio technology used for the visit.   Patient location: *** Provider location: ***   Patient: Caleb Miles   DOB: 07/09/1971   49 y.o. Male  MRN: [id number] Visit Date: 04/28/2020  Today's healthcare provider: Megan Mans, MD   Chief Complaint  Patient presents with  . Dysphagia   Subjective    HPI   Patient presents today for a Mychart visit to discuss Dysphagia. Patient says that difficulty swallowing started about 1 month ago. Patient says it happens mostly when he eats spicy foods. Patient has not had any other symptoms.   {Show patient history (optional):23778::" "}  Medications: Outpatient Medications Prior to Visit  Medication Sig  . amoxicillin-clavulanate (AUGMENTIN) 875-125 MG tablet Take 1 tablet by mouth 2 (two) times daily.  Marland Kitchen buPROPion (WELLBUTRIN XL) 150 MG 24 hr tablet TAKE 1 TABLET EVERY MORNING FOR 7 DAYS THEN 2 TABLETS FOR 7 DAYS THEN 3 TABLETS THEREAFTER  . cetirizine (ZYRTEC) 10 MG tablet Take 10 mg by mouth daily.  Marland Kitchen levothyroxine (SYNTHROID, LEVOTHROID) 75 MCG tablet Take 75 mcg by mouth daily before breakfast.    No facility-administered medications prior to visit.    Review of Systems  {Heme  Chem  Endocrine  Serology  Results Review (optional):23779::" "}  Objective    There were no vitals taken for this visit. {Show previous vital signs (optional):23777::" "}  Physical Exam     Assessment & Plan     ***  No follow-ups on file.      I discussed the assessment and treatment plan with the patient. The patient was provided an opportunity to ask questions and all were answered. The patient agreed with the plan and demonstrated an understanding of the instructions.   The patient was advised to call back or seek an in-person evaluation if the symptoms worsen or if the condition fails to improve as anticipated.  I provided *** minutes of non-face-to-face time during this encounter.  {provider attestation***:1}  Megan Mans, MD [facility] [phone] (phone) [phone] (fax)  [facility]

## 2020-04-30 ENCOUNTER — Ambulatory Visit: Payer: BC Managed Care – PPO | Admitting: Physician Assistant

## 2020-05-04 ENCOUNTER — Other Ambulatory Visit: Payer: Self-pay

## 2020-05-04 ENCOUNTER — Encounter: Payer: Self-pay | Admitting: Physician Assistant

## 2020-05-04 ENCOUNTER — Ambulatory Visit: Payer: BC Managed Care – PPO | Admitting: Physician Assistant

## 2020-05-04 VITALS — BP 140/87 | HR 90 | Temp 98.5°F | Resp 16 | Wt 210.0 lb

## 2020-05-04 DIAGNOSIS — E6609 Other obesity due to excess calories: Secondary | ICD-10-CM

## 2020-05-04 DIAGNOSIS — R1319 Other dysphagia: Secondary | ICD-10-CM | POA: Diagnosis not present

## 2020-05-04 DIAGNOSIS — Z6831 Body mass index (BMI) 31.0-31.9, adult: Secondary | ICD-10-CM

## 2020-05-04 MED ORDER — OMEPRAZOLE 40 MG PO CPDR: 40.0000 mg | DELAYED_RELEASE_CAPSULE | Freq: Every day | ORAL | 3 refills | Status: DC

## 2020-05-04 NOTE — Patient Instructions (Signed)
Dysphagia Eating Plan, Bite Size Food This diet plan is for people with moderate swallowing problems who have transitioned from pureed and minced foods. Bite size foods are soft and cut into small chunks so that they can be swallowed safely. On this eating plan, you may be instructed to drink liquids that are thickened. Work with your health care provider and your diet and nutrition specialist (dietitian) to make sure that you are following the diet safely and getting all the nutrients you need. What are tips for following this plan? General guidelines for foods   You may eat foods that are tender, soft, and moist.  Always test food texture before taking a bite. Poke food with a fork or spoon to make sure it is tender.  Food should be easy to cut and shew. Avoid large pieces of food that require a lot of chewing.  Take small bites. Each bite should be smaller than your thumb nail (about 15mm by 15 mm).  If you were on pureed and minced food diet plans, you may eat any of the foods included in those diets.  Avoid foods that are very dry, hard, sticky, chewy, coarse, or crunchy.  If instructed by your health care provider, thicken liquids. Follow your health care provider's instructions for what products to use, how to do this, and to what thickness. ? Your health care provider may recommend using a commercial thickener, rice cereal, or potato flakes. Ask your health care provider to recommend thickeners. ? Thickened liquids are usually a "pudding-like" consistency, or they may be as thick as honey or thick enough to eat with a spoon. Cooking  To moisten foods, you may add liquids while you are blending, mashing, or grinding your foods to the right consistency. These liquids include gravies, sauces, vegetable or fruit juice, milk, half and half, or water.  Strain extra liquid from foods before eating.  Reheat foods slowly to prevent a tough crust from forming.  Prepare foods in advance.  Meal planning  Eat a variety of foods to get all the nutrients you need.  Some foods may be tolerated better than others. Work with your dietitian to identify which foods are safest for you to eat.  Follow your meal plan as told by your dietitian. What foods are allowed? Grains Moist breads without nuts or seeds. Biscuits, muffins, pancakes, and waffles that are well-moistened with syrup, jelly, margarine, or butter. Cooked cereals. Moist bread stuffing. Moist rice. Well-moistened cold cereal with small chunks. Well-cooked pasta, noodles, rice, and bread dressing in small pieces and thick sauce. Soft dumplings or spaetzle in small pieces and butter or gravy. Vegetables Soft, well-cooked vegetables in small pieces. Soft-cooked, mashed potatoes. Thickened vegetable juice. Fruits Canned or cooked fruits that are soft or moist and do not have skin or seeds. Fresh, soft bananas. Thickened fruit juices. Meat and other protein foods Tender, moist meats or poultry in small pieces. Moist meatballs or meatloaf. Fish without bones. Eggs or egg substitutes in small pieces. Tofu. Tempeh and meat alternatives in small pieces. Well-cooked, tender beans, peas, baked beans, and other legumes. Dairy Thickened milk. Cream cheese. Yogurt. Cottage cheese. Sour cream. Small pieces of soft cheese. Fats and oils Butter. Oils. Margarine. Mayonnaise. Gravy. Spreads. Sweets and desserts Soft, smooth, moist desserts. Pudding. Custard. Moist cakes. Jam. Jelly. Honey. Preserves. Ask your health care provider whether you can have frozen desserts. Seasoning and other foods All seasonings and sweeteners. All sauces with small chunks. Prepared tuna, egg, or chicken   salad without raw fruits or vegetables. Moist casseroles with small, tender pieces of meat. Soups with tender meat. What foods are not allowed? Grains Coarse or dry cereals. Dry breads. Toast. Crackers. Tough, crusty breads, such as French bread and baguettes.  Dry pancakes, waffles, and muffins. Sticky rice. Dry bread stuffing. Granola. Popcorn. Chips. Vegetables All raw vegetables. Cooked corn. Rubbery or stiff cooked vegetables. Stringy vegetables, such as celery. Tough, crisp fried potatoes. Potato skins. Fruits Hard, crunchy, stringy, high-pulp, and juicy raw fruits such as apples, pineapple, papaya, and watermelon. Small, round fruits, such as grapes. Dried fruit and fruit leather. Meat and other protein foods Large pieces of meat. Dry, tough meats, such as bacon, sausage, and hot dogs. Chicken, turkey, or fish with skin and bones. Crunchy peanut butter. Nuts. Seeds. Nut and seed butters. Dairy Yogurt with nuts, seeds, or large chunks. Large chunks of cheese. Frozen desserts and milk consistency not allowed by your dietitian. Sweets and desserts Dry cakes. Chewy or dry cookies. Any desserts with nuts, seeds, dry fruits, coconut, pineapple, or anything dry, sticky, or hard. Chewy caramel. Licorice. Taffy-type candies. Ask your health care provider whether you can have frozen desserts. Seasoning and other foods Soups with tough or large chunks of meats, poultry, or vegetables. Corn or clam chowder. Smoothies with large chunks of fruit. Summary  Bite size foods can be helpful for people with moderate swallowing problems.  On the dysphagia eating plan, you may eat foods that are soft, moist, and cut into pieces smaller than 15mm by 15mm.  You may be instructed to thicken liquids. Follow your health care provider's instructions about how to do this and to what consistency. This information is not intended to replace advice given to you by your health care provider. Make sure you discuss any questions you have with your health care provider. Document Revised: 12/05/2018 Document Reviewed: 11/24/2016 Elsevier Patient Education  2020 [employer] Inc.  

## 2020-05-04 NOTE — Progress Notes (Signed)
Established patient visit   Patient: Caleb Miles   DOB: [birth year]   49 y.o. Male  MRN: [id number] Visit Date: 05/04/2020  Today's healthcare provider: Margaretann Loveless, PA-C   Chief Complaint  Patient presents with  . Dysphagia   Subjective    Gastroesophageal Reflux He complains of belching and dysphagia. He reports no abdominal pain, no chest pain, no choking, no coughing, no early satiety, no globus sensation, no heartburn, no hoarse voice, no nausea, no sore throat, no stridor, no tooth decay, no water brash or no wheezing. sour stomach. This is a recurrent problem. The current episode started more than 1 month ago. The problem occurs occasionally. The problem has been unchanged. The symptoms are aggravated by certain foods. He has tried an antacid for the symptoms.     Reports he feels food get stuck at the gastroesophageal junction. Spicy foods makes worse. Has only had one occasion where he had to "cough" food up. Normally symptoms only last a few minutes but more recently has noticed it can last for up to 15 minutes. No family history of hiatal hernia or strictures. No personal history either.   Patient Active Problem List   Diagnosis Date Noted  . OSA (obstructive sleep apnea) 09/13/2018  . Family history of hemochromatosis 02/13/2017  . ADD (attention deficit disorder) 09/20/2015  . Migraine with aura 06/28/2015  . Depression, major, single episode 11/19/2014  . Allergic rhinitis 11/24/2009  . Anxiety disorder 06/01/2006   Past Medical History:  Diagnosis Date  . ADHD (attention deficit hyperactivity disorder)   . Anxiety   . Depression   . Headache    migraines  . History of kidney stones    h/o  . Hypothyroidism   . Sleep apnea    Miles RECENTLY DX AND HAS NOT SET UP TO GET CPAP YET       Medications: Outpatient Medications Prior to Visit  Medication Sig  . buPROPion (WELLBUTRIN XL) 150 MG 24 hr tablet TAKE 1 TABLET EVERY MORNING FOR 7 DAYS  THEN 2 TABLETS FOR 7 DAYS THEN 3 TABLETS THEREAFTER  . cetirizine (ZYRTEC) 10 MG tablet Take 10 mg by mouth daily.  Marland Kitchen levothyroxine (SYNTHROID, LEVOTHROID) 75 MCG tablet Take 75 mcg by mouth daily before breakfast.   . [DISCONTINUED] amoxicillin-clavulanate (AUGMENTIN) 875-125 MG tablet Take 1 tablet by mouth 2 (two) times daily.   No facility-administered medications prior to visit.    Review of Systems  Constitutional: Negative for appetite change, chills and fever.  HENT: Negative for hoarse voice and sore throat.   Respiratory: Negative for cough, choking, chest tightness, shortness of breath and wheezing.   Cardiovascular: Negative for chest pain and palpitations.  Gastrointestinal: Positive for dysphagia. Negative for abdominal pain, heartburn, nausea and vomiting.       Dysphagia    Last CBC Lab Results  Component Value Date   WBC 5.7 02/13/2017   HGB 14.3 02/13/2017   HCT 42.5 02/13/2017   MCV 94 02/13/2017   MCH 31.5 02/13/2017   RDW 13.1 02/13/2017   PLT 224 02/13/2017   Last metabolic panel Lab Results  Component Value Date   GLUCOSE 106 (H) 11/10/2014   NA 140 11/10/2014   K 4.0 11/10/2014   CL 102 11/10/2014   CO2 28 11/10/2014   BUN 13 11/10/2014   CREATININE 1.22 11/10/2014   GFRNONAA 70 (L) 11/10/2014   GFRAA 81 (L) 11/10/2014   CALCIUM 9.8 11/10/2014   ANIONGAP 10  11/10/2014      Objective    BP 140/87   Pulse 90   Temp 98.5 F (36.9 C) (Oral)   Resp 16   Wt 210 lb (95.3 kg)   BMI 31.93 kg/m  BP Readings from Last 3 Encounters:  05/04/20 140/87  10/31/18 136/88  09/06/18 136/80   Wt Readings from Last 3 Encounters:  05/04/20 210 lb (95.3 kg)  10/31/18 203 lb 0.7 oz (92.1 kg)  10/03/18 203 lb (92.1 kg)      Physical Exam Vitals reviewed.  Constitutional:      General: He is not in acute distress.    Appearance: Normal appearance. He is well-developed. He is not ill-appearing or diaphoretic.  HENT:     Head: Normocephalic and  atraumatic.  Cardiovascular:     Rate and Rhythm: Normal rate and regular rhythm.     Heart sounds: Normal heart sounds. No murmur heard.  No friction rub. No gallop.   Pulmonary:     Effort: Pulmonary effort is normal. No respiratory distress.     Breath sounds: Normal breath sounds. No wheezing or rales.  Abdominal:     General: Abdomen is flat. Bowel sounds are normal. There is no distension.     Palpations: There is no mass.     Tenderness: There is no abdominal tenderness. There is no guarding.  Musculoskeletal:     Cervical back: Normal range of motion and neck supple.  Neurological:     Mental Status: He is alert.       No results found for any visits on 05/04/20.  Assessment & Plan     1. Other dysphagia Worsening of recent and lasting longer when it does occur. Will do trial of Omeprazole as below. Advised to avoid trigger foods, eat smaller, more frequent portions and small bites. Referral to GI placed for consideration of endoscopy. Consult appreciated.  - omeprazole (PRILOSEC) 40 MG capsule; Take 1 capsule (40 mg total) by mouth daily.  Dispense: 30 capsule; Refill: 3 - Ambulatory referral to Gastroenterology  2. Class 1 obesity due to excess calories without serious comorbidity with body mass index (BMI) of 31.0 to 31.9 in adult Counseled patient on healthy lifestyle modifications including dieting and exercise.    No follow-ups on file.      Caleb Islam, PA-C, have reviewed all documentation for this visit. The documentation on 05/04/20 for the exam, diagnosis, procedures, and orders are all accurate and complete.   Caleb Miles  [facility] [phone] (phone) [phone] (fax)  [facility]

## 2020-05-10 DIAGNOSIS — J301 Allergic rhinitis due to pollen: Secondary | ICD-10-CM | POA: Diagnosis not present

## 2020-05-11 DIAGNOSIS — F331 Major depressive disorder, recurrent, moderate: Secondary | ICD-10-CM | POA: Diagnosis not present

## 2020-05-12 DIAGNOSIS — E559 Vitamin D deficiency, unspecified: Secondary | ICD-10-CM | POA: Diagnosis not present

## 2020-05-12 DIAGNOSIS — D529 Folate deficiency anemia, unspecified: Secondary | ICD-10-CM | POA: Diagnosis not present

## 2020-05-12 DIAGNOSIS — Z1329 Encounter for screening for other suspected endocrine disorder: Secondary | ICD-10-CM | POA: Diagnosis not present

## 2020-05-12 DIAGNOSIS — Z79899 Other long term (current) drug therapy: Secondary | ICD-10-CM | POA: Diagnosis not present

## 2020-05-12 DIAGNOSIS — D519 Vitamin B12 deficiency anemia, unspecified: Secondary | ICD-10-CM | POA: Diagnosis not present

## 2020-05-12 DIAGNOSIS — E785 Hyperlipidemia, unspecified: Secondary | ICD-10-CM | POA: Diagnosis not present

## 2020-05-14 ENCOUNTER — Encounter: Payer: Self-pay | Admitting: *Deleted

## 2020-05-31 DIAGNOSIS — J301 Allergic rhinitis due to pollen: Secondary | ICD-10-CM | POA: Diagnosis not present

## 2020-06-04 DIAGNOSIS — J301 Allergic rhinitis due to pollen: Secondary | ICD-10-CM | POA: Diagnosis not present

## 2020-06-08 ENCOUNTER — Telehealth (INDEPENDENT_AMBULATORY_CARE_PROVIDER_SITE_OTHER): Payer: BC Managed Care – PPO | Admitting: Family Medicine

## 2020-06-08 ENCOUNTER — Encounter: Payer: Self-pay | Admitting: Family Medicine

## 2020-06-08 DIAGNOSIS — J301 Allergic rhinitis due to pollen: Secondary | ICD-10-CM | POA: Diagnosis not present

## 2020-06-08 DIAGNOSIS — J0101 Acute recurrent maxillary sinusitis: Secondary | ICD-10-CM | POA: Diagnosis not present

## 2020-06-08 MED ORDER — AMOXICILLIN-POT CLAVULANATE 875-125 MG PO TABS: 1.0000 | ORAL_TABLET | Freq: Two times a day (BID) | ORAL | 0 refills | Status: AC

## 2020-06-08 MED ORDER — AZELASTINE HCL 0.1 % NA SOLN: 2.0000 | Freq: Two times a day (BID) | NASAL | 4 refills | Status: DC

## 2020-06-08 NOTE — Progress Notes (Signed)
° ° °  MyChart Video Visit    Virtual Visit via Video Note   This visit type was conducted due to national recommendations for restrictions regarding the COVID-19 Pandemic (e.g. social distancing) in an effort to limit this patient's exposure and mitigate transmission in our community. This patient is at least at moderate risk for complications without adequate follow up. This format is felt to be most appropriate for this patient at this time. Physical exam was limited by quality of the video and audio technology used for the visit.   Patient location: car Provider location: [facility]  I discussed the limitations of evaluation and management by telemedicine and the availability of in person appointments. The patient expressed understanding and agreed to proceed.  Patient: Caleb Miles   DOB: 1971-01-27   49 y.o. Male  MRN: [id number] Visit Date: 06/08/2020  Today's healthcare provider: Mila Merry, MD   No chief complaint on file.  Subjective    HPI  Taking mucinex and tylenol. Has taken nasal steroids in the past for allergies but were irritating to his nose. He is currently taking OTC Zyrtec. Was treated for sinusitis similar to current sx in May and August which responds well to Augmentin.     Medications: Outpatient Medications Prior to Visit  Medication Sig   buPROPion (WELLBUTRIN XL) 150 MG 24 hr tablet TAKE 1 TABLET EVERY MORNING FOR 7 DAYS THEN 2 TABLETS FOR 7 DAYS THEN 3 TABLETS THEREAFTER   cetirizine (ZYRTEC) 10 MG tablet Take 10 mg by mouth daily.   levothyroxine (SYNTHROID, LEVOTHROID) 75 MCG tablet Take 75 mcg by mouth daily before breakfast.    omeprazole (PRILOSEC) 40 MG capsule Take 1 capsule (40 mg total) by mouth daily.   No facility-administered medications prior to visit.    Review of Systems  Constitutional: Negative.   Respiratory: Negative.   Cardiovascular: Negative.   Musculoskeletal: Negative.       Objective    There were no vitals taken  for this visit.   Physical Exam  Awake, alert, oriented x 3. In no apparent distress    Assessment & Plan     1. Seasonal allergic rhinitis due to pollen Continue cetrizine. He didn't tolerated nasal steroids, will try- azelastine (ASTELIN) 0.1 % nasal spray; Place 2 sprays into both nostrils 2 (two) times daily.  Dispense: 30 mL; Refill: 4  2. Acute recurrent maxillary sinusitis  - amoxicillin-clavulanate (AUGMENTIN) 875-125 MG tablet; Take 1 tablet by mouth 2 (two) times daily for 10 days.  Dispense: 20 tablet; Refill: 0   No follow-ups on file.     I discussed the assessment and treatment plan with the patient. The patient was provided an opportunity to ask questions and all were answered. The patient agreed with the plan and demonstrated an understanding of the instructions.   The patient was advised to call back or seek an in-person evaluation if the symptoms worsen or if the condition fails to improve as anticipated.  I provided 8 minutes of non-face-to-face time during this encounter.  The entirety of the information documented in the History of Present Illness, Review of Systems and Physical Exam were personally obtained by me. Portions of this information were initially documented by the CMA and reviewed by me for thoroughness and accuracy.     Mila Merry, MD [facility] [phone] (phone) [phone] (fax)  [facility]

## 2020-06-09 DIAGNOSIS — Z03818 Encounter for observation for suspected exposure to other biological agents ruled out: Secondary | ICD-10-CM | POA: Diagnosis not present

## 2020-06-09 DIAGNOSIS — Z1152 Encounter for screening for COVID-19: Secondary | ICD-10-CM | POA: Diagnosis not present

## 2020-06-11 ENCOUNTER — Other Ambulatory Visit: Payer: Self-pay | Admitting: Physician Assistant

## 2020-06-11 MED ORDER — PREDNISONE 10 MG (21) PO TBPK: ORAL_TABLET | ORAL | 0 refills | Status: DC

## 2020-06-11 NOTE — Progress Notes (Signed)
[facility] for [facility] congestion

## 2020-06-14 ENCOUNTER — Telehealth: Payer: BC Managed Care – PPO | Admitting: Family Medicine

## 2020-06-14 ENCOUNTER — Telehealth: Payer: Self-pay | Admitting: Physician Assistant

## 2020-06-14 DIAGNOSIS — J301 Allergic rhinitis due to pollen: Secondary | ICD-10-CM | POA: Diagnosis not present

## 2020-06-14 NOTE — Telephone Encounter (Signed)
Sherry calling from [facility] is calling back for Caleb Miles. Unable to hear the dosage for the predniSONE (STERAPRED UNI-PAK 21 TAB) 10 MG (21) TBPK tablet [[id number]]   Cb- [phone]

## 2020-06-21 DIAGNOSIS — H903 Sensorineural hearing loss, bilateral: Secondary | ICD-10-CM | POA: Diagnosis not present

## 2020-06-21 DIAGNOSIS — J301 Allergic rhinitis due to pollen: Secondary | ICD-10-CM | POA: Diagnosis not present

## 2020-06-28 DIAGNOSIS — J301 Allergic rhinitis due to pollen: Secondary | ICD-10-CM | POA: Diagnosis not present

## 2020-07-05 DIAGNOSIS — J301 Allergic rhinitis due to pollen: Secondary | ICD-10-CM | POA: Diagnosis not present

## 2020-07-08 DIAGNOSIS — J301 Allergic rhinitis due to pollen: Secondary | ICD-10-CM | POA: Diagnosis not present

## 2020-07-19 DIAGNOSIS — J301 Allergic rhinitis due to pollen: Secondary | ICD-10-CM | POA: Diagnosis not present

## 2020-07-26 DIAGNOSIS — J301 Allergic rhinitis due to pollen: Secondary | ICD-10-CM | POA: Diagnosis not present

## 2020-08-10 DIAGNOSIS — F332 Major depressive disorder, recurrent severe without psychotic features: Secondary | ICD-10-CM | POA: Diagnosis not present

## 2020-08-12 DIAGNOSIS — F332 Major depressive disorder, recurrent severe without psychotic features: Secondary | ICD-10-CM | POA: Diagnosis not present

## 2020-08-18 DIAGNOSIS — F332 Major depressive disorder, recurrent severe without psychotic features: Secondary | ICD-10-CM | POA: Diagnosis not present

## 2020-08-24 DIAGNOSIS — F332 Major depressive disorder, recurrent severe without psychotic features: Secondary | ICD-10-CM | POA: Diagnosis not present

## 2020-08-25 DIAGNOSIS — J301 Allergic rhinitis due to pollen: Secondary | ICD-10-CM | POA: Diagnosis not present

## 2020-09-06 DIAGNOSIS — J301 Allergic rhinitis due to pollen: Secondary | ICD-10-CM | POA: Diagnosis not present

## 2020-09-07 DIAGNOSIS — F332 Major depressive disorder, recurrent severe without psychotic features: Secondary | ICD-10-CM | POA: Diagnosis not present

## 2020-09-10 ENCOUNTER — Other Ambulatory Visit: Payer: Self-pay

## 2020-09-10 ENCOUNTER — Encounter: Payer: Self-pay | Admitting: Family Medicine

## 2020-09-10 ENCOUNTER — Ambulatory Visit (INDEPENDENT_AMBULATORY_CARE_PROVIDER_SITE_OTHER): Payer: BC Managed Care – PPO | Admitting: Family Medicine

## 2020-09-10 VITALS — BP 126/88 | HR 80 | Temp 98.1°F | Resp 16 | Ht 68.0 in | Wt 208.0 lb

## 2020-09-10 DIAGNOSIS — E039 Hypothyroidism, unspecified: Secondary | ICD-10-CM

## 2020-09-10 DIAGNOSIS — Z23 Encounter for immunization: Secondary | ICD-10-CM

## 2020-09-10 DIAGNOSIS — Z6831 Body mass index (BMI) 31.0-31.9, adult: Secondary | ICD-10-CM

## 2020-09-10 DIAGNOSIS — E669 Obesity, unspecified: Secondary | ICD-10-CM | POA: Diagnosis not present

## 2020-09-10 DIAGNOSIS — Z114 Encounter for screening for human immunodeficiency virus [HIV]: Secondary | ICD-10-CM

## 2020-09-10 DIAGNOSIS — R030 Elevated blood-pressure reading, without diagnosis of hypertension: Secondary | ICD-10-CM | POA: Insufficient documentation

## 2020-09-10 DIAGNOSIS — Z1159 Encounter for screening for other viral diseases: Secondary | ICD-10-CM | POA: Diagnosis not present

## 2020-09-10 NOTE — Assessment & Plan Note (Signed)
Previously well controlled Continue Synthroid at current dose  Recheck TSH  States that Psych is currently adjusting synthroid dose

## 2020-09-10 NOTE — Progress Notes (Signed)
Acute Office Visit  Subjective:    Patient ID: Caleb Miles, male    DOB: Jun 27, 1971, 50 y.o.   MRN: [id number]  Chief Complaint  Patient presents with  . Hypertension    HPI Patient is in today for elevated blood pressure with no history of hypertension diagnosis. Patient reports his blood pressure has been elevated at other doctor's office. Patient reports he does not follow a low salt diet. Patient reports not exercising daily. Patient reports slight headaches and tingling on finger tips. Patient reports BP has been checked once weekly for the past 3 weeks and has had 150's/100's. Patient reports BP is checked with a wrist monitor.   Past Medical History:  Diagnosis Date  . ADHD (attention deficit hyperactivity disorder)   . Anxiety   . Depression   . Headache    migraines  . History of kidney stones    h/o  . Hypothyroidism   . Sleep apnea    JUST RECENTLY DX AND HAS NOT SET UP TO GET CPAP YET    Past Surgical History:  Procedure Laterality Date  . IMAGE GUIDED SINUS SURGERY N/A 10/31/2018   Procedure: IMAGE GUIDED SINUS SURGERY;  Surgeon: Bud Face, MD;  Location: [facility] ORS;  Service: ENT;  Laterality: N/A;  . MAXILLARY ANTROSTOMY Right 10/31/2018   Procedure: Right MAXILLARY ANTROSTOMY without tissue removal;  Surgeon: Bud Face, MD;  Location: [facility] ORS;  Service: ENT;  Laterality: Right;  . NASAL SEPTOPLASTY W/ TURBINOPLASTY Bilateral 10/31/2018   Procedure: NASAL SEPTOPLASTY WITH Bilateral InferiorTURBINATE REDUCTION;  Surgeon: Bud Face, MD;  Location: [facility] ORS;  Service: ENT;  Laterality: Bilateral;  . TONSILLECTOMY    . TONSILLECTOMY AND ADENOIDECTOMY  22    Family History  Problem Relation Age of Onset  . Emphysema Paternal Grandfather     Social History   Socioeconomic History  . Marital status: Married    Spouse name: Not on file  . Number of children: Not on file  . Years of education: Not on file  . Highest education level: Not  on file  Occupational History  . Not on file  Tobacco Use  . Smoking status: Never Smoker  . Smokeless tobacco: Never Used  Vaping Use  . Vaping Use: Never used  Substance and Sexual Activity  . Alcohol use: No  . Drug use: No  . Sexual activity: Yes  Other Topics Concern  . Not on file  Social History Narrative  . Not on file   Social Determinants of Health   Financial Resource Strain: Not on file  Food Insecurity: Not on file  Transportation Needs: Not on file  Physical Activity: Not on file  Stress: Not on file  Social Connections: Not on file  Intimate Partner Violence: Not on file    Outpatient Medications Prior to Visit  Medication Sig Dispense Refill  . azelastine (ASTELIN) 0.1 % nasal spray Place 2 sprays into both nostrils 2 (two) times daily. 30 mL 4  . buPROPion (WELLBUTRIN XL) 150 MG 24 hr tablet TAKE 1 TABLET EVERY MORNING FOR 7 DAYS THEN 2 TABLETS FOR 7 DAYS THEN 3 TABLETS THEREAFTER    . cetirizine (ZYRTEC) 10 MG tablet Take 10 mg by mouth daily.    Marland Kitchen levothyroxine (SYNTHROID, LEVOTHROID) 75 MCG tablet Take 75 mcg by mouth daily before breakfast.   12  . omeprazole (PRILOSEC) 40 MG capsule Take 1 capsule (40 mg total) by mouth daily. 30 capsule 3  . predniSONE (STERAPRED UNI-PAK  21 TAB) 10 MG (21) TBPK tablet 6 day taper; take as directed on package instructions (Patient not taking: Reported on 09/10/2020) 21 tablet 0   No facility-administered medications prior to visit.    No Known Allergies  Review of Systems  Constitutional: Negative for chills and fatigue.  Respiratory: Positive for shortness of breath. Negative for chest tightness and wheezing.   Cardiovascular: Negative for chest pain, palpitations and leg swelling.  Neurological: Positive for numbness and headaches.       Objective:    Physical Exam Vitals reviewed.  Constitutional:      General: He is not in acute distress.    Appearance: Normal appearance. He is not diaphoretic.  HENT:      Head: Normocephalic and atraumatic.  Eyes:     General: No scleral icterus.    Conjunctiva/sclera: Conjunctivae normal.  Cardiovascular:     Rate and Rhythm: Normal rate and regular rhythm.     Pulses: Normal pulses.     Heart sounds: Normal heart sounds. No murmur heard.   Pulmonary:     Effort: Pulmonary effort is normal. No respiratory distress.     Breath sounds: Normal breath sounds. No wheezing or rhonchi.  Abdominal:     General: There is no distension.     Palpations: Abdomen is soft.     Tenderness: There is no abdominal tenderness.  Musculoskeletal:     Cervical back: Neck supple.     Right lower leg: No edema.     Left lower leg: No edema.  Lymphadenopathy:     Cervical: No cervical adenopathy.  Skin:    General: Skin is warm and dry.     Capillary Refill: Capillary refill takes less than 2 seconds.     Findings: No rash.  Neurological:     Mental Status: He is alert and oriented to person, place, and time.     Cranial Nerves: No cranial nerve deficit.  Psychiatric:        Mood and Affect: Mood normal.        Behavior: Behavior normal.     BP 126/88 (BP Location: Left Arm, Patient Position: Sitting, Cuff Size: Normal)   Pulse 80   Temp 98.1 F (36.7 C) (Oral)   Resp 16   Ht 5\' 8"  (1.727 m)   Wt 208 lb (94.3 kg)   SpO2 98%   BMI 31.63 kg/m  Wt Readings from Last 3 Encounters:  09/10/20 208 lb (94.3 kg)  05/04/20 210 lb (95.3 kg)  10/31/18 203 lb 0.7 oz (92.1 kg)    Health Maintenance Due  Topic Date Due  . Hepatitis C Screening  Never done  . COLONOSCOPY (Pts 45-80yrs Insurance coverage will need to be confirmed)  Never done    There are no preventive care reminders to display for this patient.   No results found for: TSH Lab Results  Component Value Date   WBC 5.7 02/13/2017   HGB 14.3 02/13/2017   HCT 42.5 02/13/2017   MCV 94 02/13/2017   PLT 224 02/13/2017   Lab Results  Component Value Date   NA 140 11/10/2014   K 4.0  11/10/2014   CO2 28 11/10/2014   GLUCOSE 106 (H) 11/10/2014   BUN 13 11/10/2014   CREATININE 1.22 11/10/2014   CALCIUM 9.8 11/10/2014   ANIONGAP 10 11/10/2014   Lab Results  Component Value Date   CHOL 233 (A) 11/16/2014   Lab Results  Component Value Date   HDL 46  11/16/2014   Lab Results  Component Value Date   LDLCALC 129 11/16/2014   Lab Results  Component Value Date   TRIG 288 (A) 11/16/2014   No results found for: CHOLHDL No results found for: XBJY7W     Assessment & Plan:   Problem List Items Addressed This Visit      Endocrine   Acquired hypothyroidism    Previously well controlled Continue Synthroid at current dose  Recheck TSH  States that Psych is currently adjusting synthroid dose      Relevant Orders   TSH     Other   Elevated BP without diagnosis of hypertension - Primary    Well controlled in our office today Advised home BP monitoring with a reliable cuff Discussed DASH diet and exercise F/u in 3 months and recheck      Relevant Orders   Comprehensive metabolic panel   Class 1 obesity without serious comorbidity with body mass index (BMI) of 31.0 to 31.9 in adult    Discussed importance of healthy weight management Discussed diet and exercise  Screening labs today      Relevant Orders   Lipid panel   Comprehensive metabolic panel    Other Visit Diagnoses    Encounter for screening for HIV       Relevant Orders   HIV antibody (with reflex)   Need for hepatitis C screening test       Relevant Orders   Hepatitis C Antibody   Encounter for immunization       Relevant Orders   Pfizer SARS-COV-2 Vaccine (Completed)      Return in about 6 months (around 03/10/2021) for CPE.  I, Shirlee Latch, MD, have reviewed all documentation for this visit. The documentation on 09/10/20 for the exam, diagnosis, procedures, and orders are all accurate and complete.   Bacigalupo, Marzella Schlein, MD, MPH [facility]

## 2020-09-10 NOTE — Assessment & Plan Note (Signed)
Well controlled in our office today Advised home BP monitoring with a reliable cuff Discussed DASH diet and exercise F/u in 3 months and recheck

## 2020-09-10 NOTE — Assessment & Plan Note (Signed)
Discussed importance of healthy weight management Discussed diet and exercise Screening labs today 

## 2020-09-10 NOTE — Patient Instructions (Signed)

## 2020-09-11 LAB — LIPID PANEL
Chol/HDL Ratio: 5.4 ratio — ABNORMAL HIGH (ref 0.0–5.0)
Cholesterol, Total: 242 mg/dL — ABNORMAL HIGH (ref 100–199)
HDL: 45 mg/dL (ref >39)
LDL Chol Calc (NIH): 154 mg/dL — ABNORMAL HIGH (ref 0–99)
Triglycerides: 233 mg/dL — ABNORMAL HIGH (ref 0–149)
VLDL Cholesterol Cal: 43 mg/dL — ABNORMAL HIGH (ref 5–40)

## 2020-09-11 LAB — COMPREHENSIVE METABOLIC PANEL
ALT: 18 IU/L (ref 0–44)
AST: 17 IU/L (ref 0–40)
Albumin/Globulin Ratio: 1.6 (ref 1.2–2.2)
Albumin: 4.5 g/dL (ref 3.8–4.9)
Alkaline Phosphatase: 63 IU/L (ref 44–121)
BUN/Creatinine Ratio: 10 (ref 9–20)
BUN: 12 mg/dL (ref 6–24)
Bilirubin Total: 0.4 mg/dL (ref 0.0–1.2)
CO2: 22 mmol/L (ref 20–29)
Calcium: 10 mg/dL (ref 8.7–10.2)
Chloride: 102 mmol/L (ref 96–106)
Creatinine, Ser: 1.26 mg/dL (ref 0.76–1.27)
GFR calc Af Amer: 76 mL/min/{1.73_m2} (ref >59)
GFR calc non Af Amer: 66 mL/min/{1.73_m2} (ref >59)
Globulin, Total: 2.8 g/dL (ref 1.5–4.5)
Glucose: 99 mg/dL (ref 65–99)
Potassium: 4.5 mmol/L (ref 3.5–5.2)
Sodium: 139 mmol/L (ref 134–144)
Total Protein: 7.3 g/dL (ref 6.0–8.5)

## 2020-09-11 LAB — TSH: TSH: 4.48 u[IU]/mL (ref 0.450–4.500)

## 2020-09-11 LAB — HEPATITIS C ANTIBODY: Hep C Virus Ab: 0.1 s/co ratio (ref 0.0–0.9)

## 2020-09-11 LAB — HIV ANTIBODY (ROUTINE TESTING W REFLEX): HIV Screen 4th Generation wRfx: NONREACTIVE

## 2020-09-14 ENCOUNTER — Telehealth: Payer: Self-pay

## 2020-09-14 NOTE — Telephone Encounter (Signed)
Pt advised.  He is going to work on lifestyle changes first.    Thanks,   -Vernona Rieger

## 2020-09-14 NOTE — Telephone Encounter (Signed)
-----   Message from Erasmo Downer, MD sent at 09/14/2020 11:21 AM EST ----- Normal labs, except for high cholesterol.  The 10-year ASCVD (heart disease and stroke) risk score Denman George DC Jr., et al., 2013) is: 5.4%, which is borderline.  Consider statin medication to lower the risk, but also recommend diet low in saturated fat and regular exercise - 30 min at least 5 times per week

## 2020-09-17 DIAGNOSIS — F4321 Adjustment disorder with depressed mood: Secondary | ICD-10-CM | POA: Diagnosis not present

## 2020-09-17 DIAGNOSIS — F331 Major depressive disorder, recurrent, moderate: Secondary | ICD-10-CM | POA: Diagnosis not present

## 2020-09-17 DIAGNOSIS — F341 Dysthymic disorder: Secondary | ICD-10-CM | POA: Diagnosis not present

## 2020-09-21 DIAGNOSIS — F332 Major depressive disorder, recurrent severe without psychotic features: Secondary | ICD-10-CM | POA: Diagnosis not present

## 2020-09-23 DIAGNOSIS — F332 Major depressive disorder, recurrent severe without psychotic features: Secondary | ICD-10-CM | POA: Diagnosis not present

## 2020-09-27 DIAGNOSIS — J301 Allergic rhinitis due to pollen: Secondary | ICD-10-CM | POA: Diagnosis not present

## 2020-09-28 DIAGNOSIS — F332 Major depressive disorder, recurrent severe without psychotic features: Secondary | ICD-10-CM | POA: Diagnosis not present

## 2020-10-01 DIAGNOSIS — F332 Major depressive disorder, recurrent severe without psychotic features: Secondary | ICD-10-CM | POA: Diagnosis not present

## 2020-10-11 DIAGNOSIS — J301 Allergic rhinitis due to pollen: Secondary | ICD-10-CM | POA: Diagnosis not present

## 2020-11-04 DIAGNOSIS — J301 Allergic rhinitis due to pollen: Secondary | ICD-10-CM | POA: Diagnosis not present

## 2020-11-09 DIAGNOSIS — J301 Allergic rhinitis due to pollen: Secondary | ICD-10-CM | POA: Diagnosis not present

## 2020-11-13 DIAGNOSIS — F3341 Major depressive disorder, recurrent, in partial remission: Secondary | ICD-10-CM | POA: Diagnosis not present

## 2020-11-18 DIAGNOSIS — J301 Allergic rhinitis due to pollen: Secondary | ICD-10-CM | POA: Diagnosis not present

## 2020-11-23 DIAGNOSIS — Z79899 Other long term (current) drug therapy: Secondary | ICD-10-CM | POA: Diagnosis not present

## 2020-11-23 DIAGNOSIS — Z1329 Encounter for screening for other suspected endocrine disorder: Secondary | ICD-10-CM | POA: Diagnosis not present

## 2020-11-23 DIAGNOSIS — E785 Hyperlipidemia, unspecified: Secondary | ICD-10-CM | POA: Diagnosis not present

## 2020-11-23 NOTE — Progress Notes (Signed)
This encounter was created in error - please disregard.

## 2020-11-25 ENCOUNTER — Other Ambulatory Visit: Payer: Self-pay | Admitting: Physician Assistant

## 2020-11-25 DIAGNOSIS — J301 Allergic rhinitis due to pollen: Secondary | ICD-10-CM | POA: Diagnosis not present

## 2020-11-25 DIAGNOSIS — R1319 Other dysphagia: Secondary | ICD-10-CM

## 2020-11-25 NOTE — Telephone Encounter (Signed)
Future in 3 months  

## 2020-12-07 DIAGNOSIS — F331 Major depressive disorder, recurrent, moderate: Secondary | ICD-10-CM | POA: Diagnosis not present

## 2020-12-08 ENCOUNTER — Telehealth: Payer: BC Managed Care – PPO | Admitting: Family Medicine

## 2020-12-10 ENCOUNTER — Telehealth: Payer: BC Managed Care – PPO | Admitting: Family Medicine

## 2020-12-16 DIAGNOSIS — J301 Allergic rhinitis due to pollen: Secondary | ICD-10-CM | POA: Diagnosis not present

## 2020-12-16 DIAGNOSIS — Z20822 Contact with and (suspected) exposure to covid-19: Secondary | ICD-10-CM | POA: Diagnosis not present

## 2020-12-20 DIAGNOSIS — F331 Major depressive disorder, recurrent, moderate: Secondary | ICD-10-CM | POA: Diagnosis not present

## 2020-12-23 DIAGNOSIS — J301 Allergic rhinitis due to pollen: Secondary | ICD-10-CM | POA: Diagnosis not present

## 2020-12-27 DIAGNOSIS — F331 Major depressive disorder, recurrent, moderate: Secondary | ICD-10-CM | POA: Diagnosis not present

## 2021-01-04 DIAGNOSIS — F331 Major depressive disorder, recurrent, moderate: Secondary | ICD-10-CM | POA: Diagnosis not present

## 2021-01-27 DIAGNOSIS — J301 Allergic rhinitis due to pollen: Secondary | ICD-10-CM | POA: Diagnosis not present

## 2021-02-04 DIAGNOSIS — J301 Allergic rhinitis due to pollen: Secondary | ICD-10-CM | POA: Diagnosis not present

## 2021-02-07 DIAGNOSIS — F331 Major depressive disorder, recurrent, moderate: Secondary | ICD-10-CM | POA: Diagnosis not present

## 2021-02-14 DIAGNOSIS — F331 Major depressive disorder, recurrent, moderate: Secondary | ICD-10-CM | POA: Diagnosis not present

## 2021-02-17 DIAGNOSIS — J301 Allergic rhinitis due to pollen: Secondary | ICD-10-CM | POA: Diagnosis not present

## 2021-02-21 ENCOUNTER — Other Ambulatory Visit: Payer: Self-pay | Admitting: Physician Assistant

## 2021-02-21 DIAGNOSIS — R1319 Other dysphagia: Secondary | ICD-10-CM

## 2021-03-03 DIAGNOSIS — J301 Allergic rhinitis due to pollen: Secondary | ICD-10-CM | POA: Diagnosis not present

## 2021-03-04 ENCOUNTER — Encounter: Payer: BC Managed Care – PPO | Admitting: Physician Assistant

## 2021-03-17 DIAGNOSIS — F331 Major depressive disorder, recurrent, moderate: Secondary | ICD-10-CM | POA: Diagnosis not present

## 2021-03-17 DIAGNOSIS — J301 Allergic rhinitis due to pollen: Secondary | ICD-10-CM | POA: Diagnosis not present

## 2021-03-31 DIAGNOSIS — F331 Major depressive disorder, recurrent, moderate: Secondary | ICD-10-CM | POA: Diagnosis not present

## 2021-03-31 DIAGNOSIS — J301 Allergic rhinitis due to pollen: Secondary | ICD-10-CM | POA: Diagnosis not present

## 2021-04-14 DIAGNOSIS — F331 Major depressive disorder, recurrent, moderate: Secondary | ICD-10-CM | POA: Diagnosis not present

## 2021-04-28 DIAGNOSIS — J301 Allergic rhinitis due to pollen: Secondary | ICD-10-CM | POA: Diagnosis not present

## 2021-04-29 DIAGNOSIS — J301 Allergic rhinitis due to pollen: Secondary | ICD-10-CM | POA: Diagnosis not present

## 2021-05-09 DIAGNOSIS — F331 Major depressive disorder, recurrent, moderate: Secondary | ICD-10-CM | POA: Diagnosis not present

## 2021-05-13 DIAGNOSIS — F3342 Major depressive disorder, recurrent, in full remission: Secondary | ICD-10-CM | POA: Diagnosis not present

## 2021-05-19 DIAGNOSIS — J301 Allergic rhinitis due to pollen: Secondary | ICD-10-CM | POA: Diagnosis not present

## 2021-06-07 DIAGNOSIS — F331 Major depressive disorder, recurrent, moderate: Secondary | ICD-10-CM | POA: Diagnosis not present

## 2021-06-23 DIAGNOSIS — J301 Allergic rhinitis due to pollen: Secondary | ICD-10-CM | POA: Diagnosis not present

## 2021-06-30 DIAGNOSIS — J301 Allergic rhinitis due to pollen: Secondary | ICD-10-CM | POA: Diagnosis not present

## 2021-07-20 DIAGNOSIS — J301 Allergic rhinitis due to pollen: Secondary | ICD-10-CM | POA: Diagnosis not present

## 2021-07-25 ENCOUNTER — Telehealth: Payer: Self-pay

## 2021-07-25 NOTE — Telephone Encounter (Signed)
Copied from CRM [id number]. Topic: Appointment Scheduling - Scheduling Inquiry for Clinic >> Jul 25, 2021  2:47 PM Aretta Nip wrote: Reason for CRM: PT is needing to resch his last CPE he cancelled and needs med refills. He was wanting to be worked in within the next 2 weeks. Did not know which PCP to sch with. [employer] ins. He also was hoping to get his daughter a New Pt appointment. [phone]  Chelsea Aus, also call father, Caleb Miles. States it would be great if he could get her an appt with one of the new providers. Pt will be expecting a fu call within the nxt day or so. FU [phone]

## 2021-08-03 ENCOUNTER — Encounter: Payer: BC Managed Care – PPO | Admitting: Family Medicine

## 2021-08-04 DIAGNOSIS — J301 Allergic rhinitis due to pollen: Secondary | ICD-10-CM | POA: Diagnosis not present

## 2021-08-10 ENCOUNTER — Ambulatory Visit (INDEPENDENT_AMBULATORY_CARE_PROVIDER_SITE_OTHER): Payer: BC Managed Care – PPO | Admitting: Family Medicine

## 2021-08-10 ENCOUNTER — Other Ambulatory Visit: Payer: Self-pay

## 2021-08-10 ENCOUNTER — Encounter: Payer: Self-pay | Admitting: Family Medicine

## 2021-08-10 VITALS — BP 124/81 | HR 82 | Temp 98.3°F | Resp 16 | Wt 216.0 lb

## 2021-08-10 DIAGNOSIS — E039 Hypothyroidism, unspecified: Secondary | ICD-10-CM | POA: Diagnosis not present

## 2021-08-10 DIAGNOSIS — Z0000 Encounter for general adult medical examination without abnormal findings: Secondary | ICD-10-CM | POA: Diagnosis not present

## 2021-08-10 DIAGNOSIS — Z8349 Family history of other endocrine, nutritional and metabolic diseases: Secondary | ICD-10-CM

## 2021-08-10 DIAGNOSIS — Z125 Encounter for screening for malignant neoplasm of prostate: Secondary | ICD-10-CM | POA: Diagnosis not present

## 2021-08-10 DIAGNOSIS — E669 Obesity, unspecified: Secondary | ICD-10-CM

## 2021-08-10 DIAGNOSIS — R1319 Other dysphagia: Secondary | ICD-10-CM

## 2021-08-10 DIAGNOSIS — F329 Major depressive disorder, single episode, unspecified: Secondary | ICD-10-CM

## 2021-08-10 DIAGNOSIS — Z6831 Body mass index (BMI) 31.0-31.9, adult: Secondary | ICD-10-CM

## 2021-08-10 DIAGNOSIS — G4733 Obstructive sleep apnea (adult) (pediatric): Secondary | ICD-10-CM

## 2021-08-10 DIAGNOSIS — Z1211 Encounter for screening for malignant neoplasm of colon: Secondary | ICD-10-CM

## 2021-08-10 DIAGNOSIS — J301 Allergic rhinitis due to pollen: Secondary | ICD-10-CM

## 2021-08-10 DIAGNOSIS — R030 Elevated blood-pressure reading, without diagnosis of hypertension: Secondary | ICD-10-CM

## 2021-08-10 MED ORDER — AZELASTINE HCL 0.1 % NA SOLN: 2.0000 | Freq: Two times a day (BID) | NASAL | 4 refills | Status: DC

## 2021-08-10 MED ORDER — OMEPRAZOLE 40 MG PO CPDR: 40.0000 mg | DELAYED_RELEASE_CAPSULE | Freq: Every day | ORAL | 1 refills | Status: DC

## 2021-08-10 NOTE — Assessment & Plan Note (Signed)
Recommend weight loss through diet and exercise. 

## 2021-08-10 NOTE — Assessment & Plan Note (Signed)
Normotensive today, continue to monitor.

## 2021-08-10 NOTE — Assessment & Plan Note (Signed)
Recheck labs and adjust as indicated.  

## 2021-08-10 NOTE — Assessment & Plan Note (Signed)
Doing well on current regimen, no changes made today. Continue to follow with Psych.

## 2021-08-10 NOTE — Assessment & Plan Note (Signed)
Refer for CPAP titration. 

## 2021-08-10 NOTE — Progress Notes (Signed)
BP 124/81 (BP Location: Right Arm, Patient Position: Sitting, Cuff Size: Large)    Pulse 82    Temp 98.3 F (36.8 C) (Temporal)    Resp 16    Wt 216 lb (98 kg)    SpO2 96%    BMI 32.84 kg/m    Subjective:    Patient ID: Caleb Miles, male    DOB: 1971-04-10, 50 y.o.   MRN: [id number]  HPI: Caleb Miles is a 50 y.o. male presenting on 08/10/2021 for comprehensive medical examination.   Hypothyroidism - Medications: Synthroid - Current symptoms:  none - Denies diarrhea, nervousness, and palpitations - Symptoms have been well-controlled  Depression - Medications: wellbutrin 450mg  - sees Psychiatry. - Taking: good compliance - Symptoms: none  GERD - Meds: omeprazole. Taking daily.  - Symptoms:  heartburn. Mainly with spicy food.  - Denies  denies dysphagia has not lost weight denies melena, hematochezia, hematemesis, and coffee ground emesis.   Allergies - mold, dust, seasonal, dog, cats. Gets allergy shots. On zyrtec, astelin spray.  He currently lives with: wife, 2 kids  OSA - sleep study done 2020. Doesn't have CPAP but recommended. Per sleep study documents, recommend CPAP titration study. Snoring more at night. Wakes often.   Depression Screen done today and results listed below:  Depression screen [facility] 2/9 08/10/2021 09/10/2020 09/06/2018  Decreased Interest 0 1 0  Down, Depressed, Hopeless 0 1 0  PHQ - 2 Score 0 2 0  Altered sleeping 0 1 -  Tired, decreased energy 0 1 -  Change in appetite 0 0 -  Feeling bad or failure about yourself  0 0 -  Trouble concentrating 0 0 -  Moving slowly or fidgety/restless 0 0 -  Suicidal thoughts 0 0 -  PHQ-9 Score 0 4 -  Difficult doing work/chores Not difficult at all Not difficult at all -    The patient does not have a history of falls. I did not complete a risk assessment for falls. A plan of care for falls was not documented.   Past Medical History:  Past Medical History:  Diagnosis Date   ADHD (attention  deficit hyperactivity disorder)    Anxiety    Depression    Headache    migraines   History of kidney stones    h/o   Hypothyroidism    Sleep apnea    JUST RECENTLY DX AND HAS NOT SET UP TO GET CPAP YET    Surgical History:  Past Surgical History:  Procedure Laterality Date   IMAGE GUIDED SINUS SURGERY N/A 10/31/2018   Procedure: IMAGE GUIDED SINUS SURGERY;  Surgeon: 12/31/2018, MD;  Location: [facility] ORS;  Service: ENT;  Laterality: N/A;   MAXILLARY ANTROSTOMY Right 10/31/2018   Procedure: Right MAXILLARY ANTROSTOMY without tissue removal;  Surgeon: 12/31/2018, MD;  Location: [facility] ORS;  Service: ENT;  Laterality: Right;   NASAL SEPTOPLASTY W/ TURBINOPLASTY Bilateral 10/31/2018   Procedure: NASAL SEPTOPLASTY WITH Bilateral InferiorTURBINATE REDUCTION;  Surgeon: 12/31/2018, MD;  Location: [facility] ORS;  Service: ENT;  Laterality: Bilateral;   TONSILLECTOMY     TONSILLECTOMY AND ADENOIDECTOMY  24    Medications:  Current Outpatient Medications on File Prior to Visit  Medication Sig   buPROPion (WELLBUTRIN XL) 150 MG 24 hr tablet TAKE 1 TABLET EVERY MORNING FOR 7 DAYS THEN 2 TABLETS FOR 7 DAYS THEN 3 TABLETS THEREAFTER   cetirizine (ZYRTEC) 10 MG tablet Take 10 mg by mouth daily.   Cholecalciferol (  VITAMIN D3) 1.25 MG (50000 UT) CAPS Take by mouth.   SYNTHROID 100 MCG tablet Take 100 mcg by mouth daily.   No current facility-administered medications on file prior to visit.    Allergies:  No Known Allergies  Social History:  Social History   Socioeconomic History   Marital status: Married    Spouse name: Not on file   Number of children: Not on file   Years of education: Not on file   Highest education level: Not on file  Occupational History   Not on file  Tobacco Use   Smoking status: Never   Smokeless tobacco: Never  Vaping Use   Vaping Use: Never used  Substance and Sexual Activity   Alcohol use: No   Drug use: No   Sexual activity: Yes  Other Topics  Concern   Not on file  Social History Narrative   Not on file   Social Determinants of Health   Financial Resource Strain: Not on file  Food Insecurity: Not on file  Transportation Needs: Not on file  Physical Activity: Not on file  Stress: Not on file  Social Connections: Not on file  Intimate Partner Violence: Not on file   Social History   Tobacco Use  Smoking Status Never  Smokeless Tobacco Never   Social History   Substance and Sexual Activity  Alcohol Use No    Family History:  Family History  Problem Relation Age of Onset   Emphysema Paternal Grandfather     Past medical history, surgical history, medications, allergies, family history and social history reviewed with patient today and changes made to appropriate areas of the chart.      Objective:    BP 124/81 (BP Location: Right Arm, Patient Position: Sitting, Cuff Size: Large)    Pulse 82    Temp 98.3 F (36.8 C) (Temporal)    Resp 16    Wt 216 lb (98 kg)    SpO2 96%    BMI 32.84 kg/m   Wt Readings from Last 3 Encounters:  08/10/21 216 lb (98 kg)  09/10/20 208 lb (94.3 kg)  05/04/20 210 lb (95.3 kg)    Physical Exam Constitutional:      Appearance: Normal appearance.  HENT:     Head: Normocephalic and atraumatic.     Right Ear: External ear normal.     Left Ear: External ear normal.  Cardiovascular:     Rate and Rhythm: Normal rate and regular rhythm.     Heart sounds: Normal heart sounds. No murmur heard. Pulmonary:     Effort: Pulmonary effort is normal. No respiratory distress.     Breath sounds: Normal breath sounds.  Abdominal:     General: Bowel sounds are normal.     Palpations: Abdomen is soft.     Tenderness: There is no abdominal tenderness.  Musculoskeletal:        General: Normal range of motion.     Right lower leg: No edema.     Left lower leg: No edema.  Skin:    General: Skin is warm and dry.  Neurological:     Mental Status: He is alert. Mental status is at baseline.   Psychiatric:        Mood and Affect: Mood normal.        Behavior: Behavior normal.    Results for orders placed or performed in visit on 09/10/20  Lipid panel  Result Value Ref Range   Cholesterol, Total 242 (H)  100 - 199 mg/dL   Triglycerides 413 (H) 0 - 149 mg/dL   HDL 45 >24 mg/dL   VLDL Cholesterol Cal 43 (H) 5 - 40 mg/dL   LDL Chol Calc (NIH) 401 (H) 0 - 99 mg/dL   Chol/HDL Ratio 5.4 (H) 0.0 - 5.0 ratio  Comprehensive metabolic panel  Result Value Ref Range   Glucose 99 65 - 99 mg/dL   BUN 12 6 - 24 mg/dL   Creatinine, Ser 0.27 0.76 - 1.27 mg/dL   GFR calc non Af Amer 66 >59 mL/min/1.73   GFR calc Af Amer 76 >59 mL/min/1.73   BUN/Creatinine Ratio 10 9 - 20   Sodium 139 134 - 144 mmol/L   Potassium 4.5 3.5 - 5.2 mmol/L   Chloride 102 96 - 106 mmol/L   CO2 22 20 - 29 mmol/L   Calcium 10.0 8.7 - 10.2 mg/dL   Total Protein 7.3 6.0 - 8.5 g/dL   Albumin 4.5 3.8 - 4.9 g/dL   Globulin, Total 2.8 1.5 - 4.5 g/dL   Albumin/Globulin Ratio 1.6 1.2 - 2.2   Bilirubin Total 0.4 0.0 - 1.2 mg/dL   Alkaline Phosphatase 63 44 - 121 IU/L   AST 17 0 - 40 IU/L   ALT 18 0 - 44 IU/L  TSH  Result Value Ref Range   TSH 4.480 0.450 - 4.500 uIU/mL  Hepatitis C Antibody  Result Value Ref Range   Hep C Virus Ab 0.1 0.0 - 0.9 s/co ratio  HIV antibody (with reflex)  Result Value Ref Range   HIV Screen 4th Generation wRfx Non Reactive Non Reactive      Assessment & Plan:   Problem List Items Addressed This Visit       Respiratory   Allergic rhinitis   Relevant Medications   azelastine (ASTELIN) 0.1 % nasal spray   OSA (obstructive sleep apnea)    Refer for CPAP titration      Relevant Orders   Cpap titration     Endocrine   Acquired hypothyroidism    Recheck labs and adjust as indicated      Relevant Medications   SYNTHROID 100 MCG tablet   Other Relevant Orders   TSH     Other   Depression, major, single episode    Doing well on current regimen, no changes made today.  Continue to follow with Psych.      Family history of hemochromatosis   Relevant Orders   CBC with Differential   Elevated BP without diagnosis of hypertension    Normotensive today, continue to monitor.      Relevant Orders   Comprehensive metabolic panel   Lipid panel   Class 1 obesity without serious comorbidity with body mass index (BMI) of 31.0 to 31.9 in adult    Recommend weight loss through diet and exercise.      Other Visit Diagnoses     Annual physical exam    -  Primary   Relevant Orders   Comprehensive metabolic panel   Lipid panel   TSH   Ambulatory referral to Gastroenterology   PSA   CBC with Differential   Screening for prostate cancer       Relevant Orders   PSA   Screening for colon cancer       Relevant Orders   Ambulatory referral to Gastroenterology   Other dysphagia       Relevant Medications   omeprazole (PRILOSEC) 40 MG capsule  LABORATORY TESTING:  Health maintenance labs ordered today as discussed above.   The natural history of prostate cancer and ongoing controversy regarding screening and potential treatment outcomes of prostate cancer has been discussed with the patient. The meaning of a false positive PSA and a false negative PSA has been discussed. He indicates understanding of the limitations of this screening test and wishes to proceed with screening PSA testing.   IMMUNIZATIONS:   - Tdap: Tetanus vaccination status reviewed: last tetanus booster within 10 years. - Influenza: Up to date - Pneumococcal: Not applicable - HPV: Not applicable - Shingrix vaccine:  due - COVID vaccine: has received 3 doses of mRNA vaccine  SCREENING: - Colonoscopy: Ordered today  Discussed with patient purpose of the colonoscopy is to detect colon cancer at curable precancerous or early stages   - AAA Screening: Not applicable  - Lung cancer screening: n/a  Hep C Screening: UTD STD testing and prevention (HIV/chl/gon/syphilis):  UTD Sexual History: Incontinence Symptoms:   PATIENT COUNSELING:    Advanced Care Planning: A voluntary discussion about advance care planning including the explanation and discussion of advance directives.  Discussed health care proxy and Living will, and the patient was able to identify a health care proxy as wife, Ayaan Shutes.  Patient does not have a living will at present time. If patient does have living will, I have requested they bring this to the clinic to be scanned in to their chart.  Sexuality: Discussed sexually transmitted diseases, partner selection, use of condoms, avoidance of unintended pregnancy  and contraceptive alternatives.   Advised to avoid cigarette smoking.  I discussed with the patient that most people either abstain from alcohol or drink within safe limits (<=14/week and <=4 drinks/occasion for males, <=7/weeks and <= 3 drinks/occasion for females) and that the risk for alcohol disorders and other health effects rises proportionally with the number of drinks per week and how often a drinker exceeds daily limits.  Discussed cessation/primary prevention of drug use and availability of treatment for abuse.   Diet: Encouraged to adjust caloric intake to maintain  or achieve ideal body weight, to reduce intake of dietary saturated fat and total fat, to limit sodium intake by avoiding high sodium foods and not adding table salt, and to maintain adequate dietary potassium and calcium preferably from fresh fruits, vegetables, and low-fat dairy products.    stressed the importance of regular exercise  Injury prevention: Discussed safety belts, safety helmets, smoke detector, smoking near bedding or upholstery.   Dental health: Discussed importance of regular tooth brushing, flossing, and dental visits.   Follow up plan: NEXT PREVENTATIVE PHYSICAL DUE IN 1 YEAR. Return in about 1 year (around 08/10/2022) for cpe.

## 2021-08-11 LAB — LIPID PANEL
Chol/HDL Ratio: 6.4 ratio — ABNORMAL HIGH (ref 0.0–5.0)
Cholesterol, Total: 230 mg/dL — ABNORMAL HIGH (ref 100–199)
HDL: 36 mg/dL — ABNORMAL LOW (ref >39)
LDL Chol Calc (NIH): 101 mg/dL — ABNORMAL HIGH (ref 0–99)
Triglycerides: 548 mg/dL — ABNORMAL HIGH (ref 0–149)
VLDL Cholesterol Cal: 93 mg/dL — ABNORMAL HIGH (ref 5–40)

## 2021-08-11 LAB — COMPREHENSIVE METABOLIC PANEL
ALT: 24 IU/L (ref 0–44)
AST: 21 IU/L (ref 0–40)
Albumin/Globulin Ratio: 1.7 (ref 1.2–2.2)
Albumin: 4.5 g/dL (ref 3.8–4.9)
Alkaline Phosphatase: 74 IU/L (ref 44–121)
BUN/Creatinine Ratio: 11 (ref 9–20)
BUN: 13 mg/dL (ref 6–24)
Bilirubin Total: 0.2 mg/dL (ref 0.0–1.2)
CO2: 23 mmol/L (ref 20–29)
Calcium: 9.8 mg/dL (ref 8.7–10.2)
Chloride: 101 mmol/L (ref 96–106)
Creatinine, Ser: 1.21 mg/dL (ref 0.76–1.27)
Globulin, Total: 2.6 g/dL (ref 1.5–4.5)
Glucose: 87 mg/dL (ref 70–99)
Potassium: 4.4 mmol/L (ref 3.5–5.2)
Sodium: 139 mmol/L (ref 134–144)
Total Protein: 7.1 g/dL (ref 6.0–8.5)
eGFR: 72 mL/min/{1.73_m2} (ref >59)

## 2021-08-11 LAB — CBC WITH DIFFERENTIAL/PLATELET
Basophils Absolute: 0 10*3/uL (ref 0.0–0.2)
Basos: 1 %
EOS (ABSOLUTE): 0 10*3/uL (ref 0.0–0.4)
Eos: 1 %
Hematocrit: 44.3 % (ref 37.5–51.0)
Hemoglobin: 15.4 g/dL (ref 13.0–17.7)
Immature Grans (Abs): 0 10*3/uL (ref 0.0–0.1)
Immature Granulocytes: 1 %
Lymphocytes Absolute: 1.5 10*3/uL (ref 0.7–3.1)
Lymphs: 28 %
MCH: 31.4 pg (ref 26.6–33.0)
MCHC: 34.8 g/dL (ref 31.5–35.7)
MCV: 90 fL (ref 79–97)
Monocytes Absolute: 0.5 10*3/uL (ref 0.1–0.9)
Monocytes: 9 %
Neutrophils Absolute: 3.4 10*3/uL (ref 1.4–7.0)
Neutrophils: 60 %
Platelets: 275 10*3/uL (ref 150–450)
RBC: 4.9 x10E6/uL (ref 4.14–5.80)
RDW: 12.1 % (ref 11.6–15.4)
WBC: 5.6 10*3/uL (ref 3.4–10.8)

## 2021-08-11 LAB — PSA: Prostate Specific Ag, Serum: 1.5 ng/mL (ref 0.0–4.0)

## 2021-08-11 LAB — TSH: TSH: 1.44 u[IU]/mL (ref 0.450–4.500)

## 2021-08-11 NOTE — Addendum Note (Signed)
Addended by: Caro Laroche on: 08/11/2021 10:37 AM   Modules accepted: Orders

## 2021-08-16 DIAGNOSIS — Z13220 Encounter for screening for lipoid disorders: Secondary | ICD-10-CM | POA: Diagnosis not present

## 2021-08-16 DIAGNOSIS — E039 Hypothyroidism, unspecified: Secondary | ICD-10-CM | POA: Diagnosis not present

## 2021-08-16 DIAGNOSIS — Z0000 Encounter for general adult medical examination without abnormal findings: Secondary | ICD-10-CM | POA: Diagnosis not present

## 2021-08-16 DIAGNOSIS — Z13228 Encounter for screening for other metabolic disorders: Secondary | ICD-10-CM | POA: Diagnosis not present

## 2021-08-17 LAB — COMPREHENSIVE METABOLIC PANEL
ALT: 23 IU/L (ref 0–44)
AST: 21 IU/L (ref 0–40)
Albumin/Globulin Ratio: 1.8 (ref 1.2–2.2)
Albumin: 4.6 g/dL (ref 3.8–4.9)
Alkaline Phosphatase: 73 IU/L (ref 44–121)
BUN/Creatinine Ratio: 10 (ref 9–20)
BUN: 14 mg/dL (ref 6–24)
Bilirubin Total: 0.5 mg/dL (ref 0.0–1.2)
CO2: 24 mmol/L (ref 20–29)
Calcium: 10 mg/dL (ref 8.7–10.2)
Chloride: 103 mmol/L (ref 96–106)
Creatinine, Ser: 1.35 mg/dL — ABNORMAL HIGH (ref 0.76–1.27)
Globulin, Total: 2.5 g/dL (ref 1.5–4.5)
Glucose: 105 mg/dL — ABNORMAL HIGH (ref 70–99)
Potassium: 5.1 mmol/L (ref 3.5–5.2)
Sodium: 139 mmol/L (ref 134–144)
Total Protein: 7.1 g/dL (ref 6.0–8.5)
eGFR: 63 mL/min/{1.73_m2} (ref >59)

## 2021-08-17 LAB — LIPID PANEL
Chol/HDL Ratio: 5 ratio (ref 0.0–5.0)
Cholesterol, Total: 225 mg/dL — ABNORMAL HIGH (ref 100–199)
HDL: 45 mg/dL (ref >39)
LDL Chol Calc (NIH): 154 mg/dL — ABNORMAL HIGH (ref 0–99)
Triglycerides: 144 mg/dL (ref 0–149)
VLDL Cholesterol Cal: 26 mg/dL (ref 5–40)

## 2021-08-23 ENCOUNTER — Telehealth: Payer: Self-pay

## 2021-08-23 NOTE — Telephone Encounter (Signed)
CALLED PATIENT NO ANSWER LEFT VOICEMAIL FOR A CALL BACK ? ?

## 2021-08-26 ENCOUNTER — Encounter: Payer: Self-pay | Admitting: *Deleted

## 2021-08-31 ENCOUNTER — Telehealth: Payer: Self-pay | Admitting: Physician Assistant

## 2021-08-31 ENCOUNTER — Ambulatory Visit: Payer: Self-pay | Admitting: *Deleted

## 2021-08-31 ENCOUNTER — Telehealth: Payer: BC Managed Care – PPO | Admitting: Physician Assistant

## 2021-08-31 DIAGNOSIS — B9689 Other specified bacterial agents as the cause of diseases classified elsewhere: Secondary | ICD-10-CM | POA: Diagnosis not present

## 2021-08-31 DIAGNOSIS — J019 Acute sinusitis, unspecified: Secondary | ICD-10-CM

## 2021-08-31 MED ORDER — DOXYCYCLINE HYCLATE 100 MG PO TABS: 100.0000 mg | ORAL_TABLET | Freq: Two times a day (BID) | ORAL | 0 refills | Status: DC

## 2021-08-31 NOTE — Progress Notes (Signed)
I have spent 5 minutes in review of e-visit questionnaire, review and updating patient chart, medical decision making and response to patient.   Mazikeen Hehn Cody Makira Holleman, PA-C    

## 2021-08-31 NOTE — Telephone Encounter (Signed)
Pt calling in experiencing chest and head congestion, with a cough, seeking medical advise, his appt is on Friday 09/02/21 with pcp        Chief Complaint: cough, nasal drainage Symptoms: productive cough, greenish, sinus pain, LGT Frequency: onset 1 week ago Pertinent Negatives: Patient denies SOB, CP Disposition: [] ED /[] Urgent Care (no appt availability in office) / [] Appointment(In office/virtual)/ [x]  [facility]/ [] Home Care/ [] Refused Recommended Disposition /[] [facility]/ []  Follow-up with PCP Additional Notes: Pt has been taking multiple OTC meds, ineffective. Pt would like to keep appt Friday as follow up. Will do EVisit as recommended.  Reason for Disposition  [1] Continuous (nonstop) coughing interferes with work or school AND [2] no improvement using cough treatment per Care Advice  Answer Assessment - Initial Assessment Questions 1. ONSET: "When did the cough begin?"      1 week ago 2. SEVERITY: "How bad is the cough today?"      "Bad spells, awake at night" 3. SPUTUM: "Describe the color of your sputum" (none, dry cough; clear, white, yellow, green)     Greenish 4. HEMOPTYSIS: "Are you coughing up any blood?" If so ask: "How much?" (flecks, streaks, tablespoons, etc.)     When blow my nose in AMs 5. DIFFICULTY BREATHING: "Are you having difficulty breathing?" If Yes, ask: "How bad is it?" (e.g., mild, moderate, severe)    - MILD: No SOB at rest, mild SOB with walking, speaks normally in sentences, can lie down, no retractions, pulse < 100.    - MODERATE: SOB at rest, SOB with minimal exertion and prefers to sit, cannot lie down flat, speaks in phrases, mild retractions, audible wheezing, pulse 100-120.    - SEVERE: Very SOB at rest, speaks in single words, struggling to breathe, sitting hunched forward, retractions, pulse > 120      No 6. FEVER: "Do you have a fever?" If Yes, ask: "What is your temperature, how was it measured, and when did it  start?"     Occasionally 99.5 7. CARDIAC HISTORY: "Do you have any history of heart disease?" (e.g., heart attack, congestive heart failure)      *No Answer* 8. LUNG HISTORY: "Do you have any history of lung disease?"  (e.g., pulmonary embolus, asthma, emphysema)     *No Answer* 9. PE RISK FACTORS: "Do you have a history of blood clots?" (or: recent major surgery, recent prolonged travel, bedridden)     *No Answer* 10. OTHER SYMPTOMS: "Do you have any other symptoms?" (e.g., runny nose, wheezing, chest pain)       Runny nose,comes and goes, front on sinus clear, back sinus' clogged up, headaches.  Protocols used: Cough - Acute Productive-A-AH

## 2021-08-31 NOTE — Telephone Encounter (Signed)
Caleb Miles from [facility] [employer] [facility] [employer] , called in says already has referral. She needs the orders signed. Please call back.

## 2021-08-31 NOTE — Progress Notes (Signed)

## 2021-08-31 NOTE — Telephone Encounter (Signed)
I have not received any paperwork

## 2021-09-02 ENCOUNTER — Telehealth: Payer: BC Managed Care – PPO | Admitting: Physician Assistant

## 2021-09-08 ENCOUNTER — Telehealth: Payer: BC Managed Care – PPO | Admitting: Physician Assistant

## 2021-09-08 DIAGNOSIS — J069 Acute upper respiratory infection, unspecified: Secondary | ICD-10-CM

## 2021-09-08 MED ORDER — IPRATROPIUM BROMIDE 0.03 % NA SOLN: 2.0000 | Freq: Two times a day (BID) | NASAL | 0 refills | Status: DC

## 2021-09-08 MED ORDER — PREDNISONE 20 MG PO TABS: 40.0000 mg | ORAL_TABLET | Freq: Every day | ORAL | 0 refills | Status: DC

## 2021-09-08 MED ORDER — BENZONATATE 100 MG PO CAPS: 100.0000 mg | ORAL_CAPSULE | Freq: Three times a day (TID) | ORAL | 0 refills | Status: DC | PRN

## 2021-09-08 NOTE — Progress Notes (Signed)

## 2021-09-15 DIAGNOSIS — J301 Allergic rhinitis due to pollen: Secondary | ICD-10-CM | POA: Diagnosis not present

## 2021-09-29 DIAGNOSIS — J301 Allergic rhinitis due to pollen: Secondary | ICD-10-CM | POA: Diagnosis not present

## 2021-10-05 DIAGNOSIS — J301 Allergic rhinitis due to pollen: Secondary | ICD-10-CM | POA: Diagnosis not present

## 2021-10-06 ENCOUNTER — Ambulatory Visit: Payer: BC Managed Care – PPO | Admitting: Physician Assistant

## 2021-10-06 ENCOUNTER — Encounter: Payer: Self-pay | Admitting: Physician Assistant

## 2021-10-06 ENCOUNTER — Other Ambulatory Visit: Payer: Self-pay

## 2021-10-06 VITALS — BP 128/99 | HR 81 | Ht 68.0 in | Wt 208.4 lb

## 2021-10-06 DIAGNOSIS — Z1211 Encounter for screening for malignant neoplasm of colon: Secondary | ICD-10-CM | POA: Diagnosis not present

## 2021-10-06 DIAGNOSIS — S161XXA Strain of muscle, fascia and tendon at neck level, initial encounter: Secondary | ICD-10-CM | POA: Diagnosis not present

## 2021-10-06 DIAGNOSIS — G43111 Migraine with aura, intractable, with status migrainosus: Secondary | ICD-10-CM | POA: Diagnosis not present

## 2021-10-06 MED ORDER — KETOROLAC TROMETHAMINE 60 MG/2ML IM SOLN
60.0000 mg | Freq: Once | INTRAMUSCULAR | Status: AC
  Administered 2021-10-06: 60 mg via INTRAMUSCULAR

## 2021-10-06 MED ORDER — CYCLOBENZAPRINE HCL 5 MG PO TABS: 5.0000 mg | ORAL_TABLET | Freq: Every evening | ORAL | 0 refills | Status: DC | PRN

## 2021-10-06 MED ORDER — SUMATRIPTAN SUCCINATE 50 MG PO TABS: 50.0000 mg | ORAL_TABLET | Freq: Every day | ORAL | 0 refills | Status: AC | PRN

## 2021-10-06 NOTE — Progress Notes (Signed)
Established patient visit   Patient: Caleb Miles   DOB: [birth year]   51 y.o. Male  MRN: [id number] Visit Date: 10/06/2021  Today's healthcare provider: Alfredia Ferguson, PA-C   Cc. migraine  Subjective    HPI  Caleb Miles is a 51 y/o male with history of migraines w/ aura who presents today with a migraine since Tuesday, three days. He very sporadically gets these migraines, 3-4 times a year, typically manages with OTC pain medication, benadryl, and sleeps them off. He describes the pain as pounding on the right side, 6/10 pain. His aura is identical to his typical aura, he gets changes in his vision that appear as a spot and grow, and he feels numb on the left side of his face and left fingers. Denies weakness, changes in speech. Reports history of full migraine w/u including MRI. Last medication was advil at 6 am.   He also reports some right sided neck tension for the past few weeks, he feels it is stiff and he is unable to turn his head fully to the right or left. Denies fevers, chills, sick contacts.  Medications: Outpatient Medications Prior to Visit  Medication Sig   azelastine (ASTELIN) 0.1 % nasal spray Place 2 sprays into both nostrils 2 (two) times daily.   buPROPion (WELLBUTRIN XL) 150 MG 24 hr tablet TAKE 1 TABLET EVERY MORNING FOR 7 DAYS THEN 2 TABLETS FOR 7 DAYS THEN 3 TABLETS THEREAFTER   cetirizine (ZYRTEC) 10 MG tablet Take 10 mg by mouth daily.   Cholecalciferol (VITAMIN D3) 1.25 MG (50000 UT) CAPS Take by mouth.   omeprazole (PRILOSEC) 40 MG capsule Take 1 capsule (40 mg total) by mouth daily.   SYNTHROID 100 MCG tablet Take 100 mcg by mouth daily.   [DISCONTINUED] predniSONE (DELTASONE) 20 MG tablet Take 2 tablets (40 mg total) by mouth daily with breakfast.   benzonatate (TESSALON) 100 MG capsule Take 1 capsule (100 mg total) by mouth 3 (three) times daily as needed. (Patient not taking: Reported on 10/06/2021)   [DISCONTINUED] doxycycline (VIBRA-TABS) 100 MG  tablet Take 1 tablet (100 mg total) by mouth 2 (two) times daily. (Patient not taking: Reported on 10/06/2021)   [DISCONTINUED] ipratropium (ATROVENT) 0.03 % nasal spray Place 2 sprays into both nostrils every 12 (twelve) hours. (Patient not taking: Reported on 10/06/2021)   No facility-administered medications prior to visit.    Review of Systems  Constitutional:  Negative for fatigue and fever.  Respiratory:  Negative for cough and shortness of breath.   Cardiovascular:  Negative for chest pain, palpitations and leg swelling.  Neurological:  Positive for numbness and headaches. Negative for dizziness.      Objective    Blood pressure (!) 128/99, pulse 81, height 5\' 8"  (1.727 m), weight 208 lb 6.4 oz (94.5 kg), SpO2 98 %.   Physical Exam Constitutional:      General: He is awake.     Appearance: He is well-developed. He is diaphoretic. He is not ill-appearing.  HENT:     Head: Normocephalic.  Eyes:     Conjunctiva/sclera: Conjunctivae normal.     Pupils: Pupils are equal, round, and reactive to light.  Cardiovascular:     Rate and Rhythm: Normal rate and regular rhythm.     Heart sounds: Normal heart sounds.  Pulmonary:     Effort: Pulmonary effort is normal.     Breath sounds: Normal breath sounds.  Musculoskeletal:     Comments: Tension to b/l  neck muscles  Skin:    General: Skin is warm.  Neurological:     Mental Status: He is alert and oriented to person, place, and time.     Comments: Sensation intact b/l upper and lower fact Sensation intact b/l hands/fingers. No weakness of upper extremities. No drooping of face, symmetric.   Psychiatric:        Attention and Perception: Attention normal.        Mood and Affect: Mood normal.        Speech: Speech normal.        Behavior: Behavior is cooperative.    No results found for any visits on 10/06/21.  Assessment & Plan     Migraine, status migranosus, intractable IM toradol today in office. Rx sumatripan, advised he  take one when he gets home, and he can take another in 2 hours if no relief.  Advised if no relief, to call office and we can try a steroid pack.   2. Neck strain Rx flexeril to use before bed--advised not to take w/ benadryl Advise se of sedation  Continue heat  3. Colon cancer screening --due for colonoscopy, ordered Return if symptoms worsen or fail to improve.      I, Alfredia Ferguson, PA-C have reviewed all documentation for this visit. The documentation on  10/06/2021  for the exam, diagnosis, procedures, and orders are all accurate and complete.    Alfredia Ferguson, PA-C  [facility] [phone] (phone) [phone] (fax)  [facility]

## 2021-10-07 ENCOUNTER — Telehealth: Payer: Self-pay

## 2021-10-07 NOTE — Telephone Encounter (Signed)
CALLED PATIENT NO ANSWER LEFT VOICEMAIL FOR A CALL BACK ? ?

## 2021-10-10 ENCOUNTER — Telehealth: Payer: Self-pay

## 2021-10-10 NOTE — Telephone Encounter (Signed)
CALLED PATIENT NO ANSWER LEFT VOICEMAIL FOR A CALL BACK °Letter sent °

## 2021-10-12 DIAGNOSIS — J301 Allergic rhinitis due to pollen: Secondary | ICD-10-CM | POA: Diagnosis not present

## 2021-10-13 DIAGNOSIS — J301 Allergic rhinitis due to pollen: Secondary | ICD-10-CM | POA: Diagnosis not present

## 2021-10-27 DIAGNOSIS — J301 Allergic rhinitis due to pollen: Secondary | ICD-10-CM | POA: Diagnosis not present

## 2021-11-17 DIAGNOSIS — J301 Allergic rhinitis due to pollen: Secondary | ICD-10-CM | POA: Diagnosis not present

## 2021-11-24 DIAGNOSIS — J301 Allergic rhinitis due to pollen: Secondary | ICD-10-CM | POA: Diagnosis not present

## 2021-12-08 DIAGNOSIS — J301 Allergic rhinitis due to pollen: Secondary | ICD-10-CM | POA: Diagnosis not present

## 2021-12-15 DIAGNOSIS — J301 Allergic rhinitis due to pollen: Secondary | ICD-10-CM | POA: Diagnosis not present

## 2021-12-29 DIAGNOSIS — J301 Allergic rhinitis due to pollen: Secondary | ICD-10-CM | POA: Diagnosis not present

## 2022-01-04 DIAGNOSIS — J301 Allergic rhinitis due to pollen: Secondary | ICD-10-CM | POA: Diagnosis not present

## 2022-01-12 DIAGNOSIS — J301 Allergic rhinitis due to pollen: Secondary | ICD-10-CM | POA: Diagnosis not present

## 2022-01-26 DIAGNOSIS — J301 Allergic rhinitis due to pollen: Secondary | ICD-10-CM | POA: Diagnosis not present

## 2022-02-23 DIAGNOSIS — J301 Allergic rhinitis due to pollen: Secondary | ICD-10-CM | POA: Diagnosis not present

## 2022-03-29 DIAGNOSIS — J301 Allergic rhinitis due to pollen: Secondary | ICD-10-CM | POA: Diagnosis not present

## 2022-05-04 DIAGNOSIS — J301 Allergic rhinitis due to pollen: Secondary | ICD-10-CM | POA: Diagnosis not present

## 2022-05-08 DIAGNOSIS — J301 Allergic rhinitis due to pollen: Secondary | ICD-10-CM | POA: Diagnosis not present

## 2022-06-07 ENCOUNTER — Telehealth: Payer: BC Managed Care – PPO | Admitting: Physician Assistant

## 2022-06-07 DIAGNOSIS — J019 Acute sinusitis, unspecified: Secondary | ICD-10-CM | POA: Diagnosis not present

## 2022-06-07 MED ORDER — AMOXICILLIN-POT CLAVULANATE 875-125 MG PO TABS: 1.0000 | ORAL_TABLET | Freq: Two times a day (BID) | ORAL | 0 refills | Status: AC

## 2022-06-07 NOTE — Progress Notes (Signed)

## 2022-06-12 DIAGNOSIS — J301 Allergic rhinitis due to pollen: Secondary | ICD-10-CM | POA: Diagnosis not present

## 2022-06-23 DIAGNOSIS — J301 Allergic rhinitis due to pollen: Secondary | ICD-10-CM | POA: Diagnosis not present

## 2022-07-10 DIAGNOSIS — J301 Allergic rhinitis due to pollen: Secondary | ICD-10-CM | POA: Diagnosis not present

## 2022-07-17 DIAGNOSIS — J301 Allergic rhinitis due to pollen: Secondary | ICD-10-CM | POA: Diagnosis not present

## 2022-08-07 DIAGNOSIS — J301 Allergic rhinitis due to pollen: Secondary | ICD-10-CM | POA: Diagnosis not present

## 2022-08-14 DIAGNOSIS — J301 Allergic rhinitis due to pollen: Secondary | ICD-10-CM | POA: Diagnosis not present

## 2022-08-16 ENCOUNTER — Encounter: Payer: BC Managed Care – PPO | Admitting: Physician Assistant

## 2022-09-04 DIAGNOSIS — J301 Allergic rhinitis due to pollen: Secondary | ICD-10-CM | POA: Diagnosis not present

## 2022-09-15 DIAGNOSIS — J301 Allergic rhinitis due to pollen: Secondary | ICD-10-CM | POA: Diagnosis not present

## 2022-09-22 ENCOUNTER — Encounter: Payer: BC Managed Care – PPO | Admitting: Physician Assistant

## 2022-09-25 DIAGNOSIS — J301 Allergic rhinitis due to pollen: Secondary | ICD-10-CM | POA: Diagnosis not present

## 2022-10-02 DIAGNOSIS — J301 Allergic rhinitis due to pollen: Secondary | ICD-10-CM | POA: Diagnosis not present

## 2022-10-23 DIAGNOSIS — J301 Allergic rhinitis due to pollen: Secondary | ICD-10-CM | POA: Diagnosis not present

## 2022-11-13 DIAGNOSIS — J301 Allergic rhinitis due to pollen: Secondary | ICD-10-CM | POA: Diagnosis not present

## 2022-11-27 DIAGNOSIS — J301 Allergic rhinitis due to pollen: Secondary | ICD-10-CM | POA: Diagnosis not present

## 2022-12-06 DIAGNOSIS — J301 Allergic rhinitis due to pollen: Secondary | ICD-10-CM | POA: Diagnosis not present

## 2022-12-25 DIAGNOSIS — J301 Allergic rhinitis due to pollen: Secondary | ICD-10-CM | POA: Diagnosis not present

## 2023-01-08 DIAGNOSIS — J301 Allergic rhinitis due to pollen: Secondary | ICD-10-CM | POA: Diagnosis not present

## 2023-01-24 ENCOUNTER — Encounter: Payer: Self-pay | Admitting: Physician Assistant

## 2023-01-24 ENCOUNTER — Other Ambulatory Visit: Payer: Self-pay | Admitting: Family Medicine

## 2023-01-24 ENCOUNTER — Ambulatory Visit: Payer: BC Managed Care – PPO | Admitting: Physician Assistant

## 2023-01-24 VITALS — BP 138/83 | HR 89 | Temp 98.0°F | Ht 67.0 in | Wt 211.2 lb

## 2023-01-24 DIAGNOSIS — J0111 Acute recurrent frontal sinusitis: Secondary | ICD-10-CM | POA: Diagnosis not present

## 2023-01-24 DIAGNOSIS — Z125 Encounter for screening for malignant neoplasm of prostate: Secondary | ICD-10-CM

## 2023-01-24 DIAGNOSIS — R1319 Other dysphagia: Secondary | ICD-10-CM

## 2023-01-24 DIAGNOSIS — R5383 Other fatigue: Secondary | ICD-10-CM | POA: Diagnosis not present

## 2023-01-24 DIAGNOSIS — R739 Hyperglycemia, unspecified: Secondary | ICD-10-CM

## 2023-01-24 MED ORDER — AMOXICILLIN 875 MG PO TABS
875.0000 mg | ORAL_TABLET | Freq: Two times a day (BID) | ORAL | 0 refills | Status: AC
Start: 2023-01-24 — End: 2023-01-31

## 2023-01-24 NOTE — Progress Notes (Signed)
I,Sha'taria Tyson,acting as a Neurosurgeon for [employer], PA-C.,have documented all relevant documentation on the behalf of Alfredia Ferguson, PA-C,as directed by  Alfredia Ferguson, PA-C while in the presence of Alfredia Ferguson, PA-C.   Established patient visit   Patient: Caleb Miles   DOB: 06/17/1971   52 y.o. Male  MRN: [id number] Visit Date: 01/24/2023  Today's healthcare provider: Alfredia Ferguson, PA-C   Cc. Nasal congestion, facial pain, ear pain x 1 week  Subjective    HPI  Upper respiratory symptoms He complains of left ear pressure/pain, facial pain, low grade fever, nasal congestion, post nasal drip, purulent nasal discharge, and sore throat.with no fever, chills, night sweats or weight loss. Onset of symptoms was about a week ago and staying constant.He is drinking plenty of fluids.  Past history is significant for no history of pneumonia or bronchitis. Patient is non-smoker ---------------------------------------------------------------------------------------------------  Pt reports fatigue, weight gain, and generally not feeling himself. He would like regular blood work and testosterone checked.    Medications: Outpatient Medications Prior to Visit  Medication Sig   cetirizine (ZYRTEC) 10 MG tablet Take 10 mg by mouth daily.   Cholecalciferol (VITAMIN D3) 1.25 MG (50000 UT) CAPS Take by mouth.   omeprazole (PRILOSEC) 40 MG capsule Take 1 capsule (40 mg total) by mouth daily.   SUMAtriptan (IMITREX) 50 MG tablet Take 1 tablet (50 mg total) by mouth daily as needed for migraine. May repeat in 2 hours if headache persists or recurs.   SYNTHROID 100 MCG tablet Take 100 mcg by mouth daily.   azelastine (ASTELIN) 0.1 % nasal spray Place 2 sprays into both nostrils 2 (two) times daily. (Patient not taking: Reported on 01/24/2023)   benzonatate (TESSALON) 100 MG capsule Take 1 capsule (100 mg total) by mouth 3 (three) times daily as needed. (Patient not taking: Reported on 10/06/2021)    buPROPion (WELLBUTRIN XL) 150 MG 24 hr tablet TAKE 1 TABLET EVERY MORNING FOR 7 DAYS THEN 2 TABLETS FOR 7 DAYS THEN 3 TABLETS THEREAFTER (Patient not taking: Reported on 01/24/2023)   cyclobenzaprine (FLEXERIL) 5 MG tablet Take 1 tablet (5 mg total) by mouth at bedtime as needed for muscle spasms. (Patient not taking: Reported on 01/24/2023)   No facility-administered medications prior to visit.    Review of Systems  Constitutional:  Positive for fatigue. Negative for fever.  HENT:  Positive for congestion, ear pain, postnasal drip, rhinorrhea, sinus pressure and sinus pain.   Respiratory:  Negative for cough and shortness of breath.   Cardiovascular:  Negative for chest pain, palpitations and leg swelling.  Neurological:  Negative for dizziness and headaches.      Objective    BP 138/83 (BP Location: Right Arm, Patient Position: Sitting, Cuff Size: Normal)   Pulse 89   Temp 98 F (36.7 C) (Oral)   Ht 5\' 7"  (1.702 m)   Wt 211 lb 3.2 oz (95.8 kg)   SpO2 98%   BMI 33.08 kg/m   Physical Exam Constitutional:      General: He is awake.     Appearance: He is well-developed.  HENT:     Head: Normocephalic.     Right Ear: Tympanic membrane normal.     Left Ear: Tympanic membrane normal.     Nose: Rhinorrhea present.     Mouth/Throat:     Pharynx: No oropharyngeal exudate or posterior oropharyngeal erythema.  Eyes:     Conjunctiva/sclera: Conjunctivae normal.  Cardiovascular:     Rate and  Rhythm: Normal rate and regular rhythm.     Heart sounds: Normal heart sounds.  Pulmonary:     Effort: Pulmonary effort is normal.     Breath sounds: Normal breath sounds.  Skin:    General: Skin is warm.  Neurological:     Mental Status: He is alert and oriented to person, place, and time.  Psychiatric:        Attention and Perception: Attention normal.        Mood and Affect: Mood normal.        Speech: Speech normal.        Behavior: Behavior is cooperative.      No results found  for any visits on 01/24/23.  Assessment & Plan     1. Acute recurrent frontal sinusitis Recommending antihis, saline nasal sprays, azelastine spray before starting abx to see if this helps his symptoms  If no improvement, ok to start amox.  - amoxicillin (AMOXIL) 875 MG tablet; Take 1 tablet (875 mg total) by mouth 2 (two) times daily for 7 days.  Dispense: 14 tablet; Refill: 0  2. Hyperglycemia Historically will check - HgB A1c  3. Prostate cancer screening - PSA  4. Other fatigue Advised more likely 2/2 to diet/exercise/lifestyle  Some element of low testosterone is normal as men age  - TSH + free T4 - Testosterone,Free and Total - CBC w/Diff/Platelet - Comprehensive Metabolic Panel (CMET)   Return if symptoms worsen or fail to improve.      I, Alfredia Ferguson, PA-C have reviewed all documentation for this visit. The documentation on  01/24/23   for the exam, diagnosis, procedures, and orders are all accurate and complete. Alfredia Ferguson, PA-C [facility] [address] [place], [place], [address] Office: [phone] Fax: [phone]   [facility]

## 2023-01-25 LAB — CBC WITH DIFFERENTIAL/PLATELET
Basophils Absolute: 0 10*3/uL (ref 0.0–0.2)
Basos: 1 %
Eos: 1 %
Hematocrit: 47.9 % (ref 37.5–51.0)
Hemoglobin: 16.4 g/dL (ref 13.0–17.7)
Immature Grans (Abs): 0 10*3/uL (ref 0.0–0.1)
Immature Granulocytes: 1 %
Lymphs: 23 %
MCHC: 34.2 g/dL (ref 31.5–35.7)
RBC: 5.17 x10E6/uL (ref 4.14–5.80)
RDW: 12 % (ref 11.6–15.4)

## 2023-01-25 LAB — COMPREHENSIVE METABOLIC PANEL
ALT: 25 IU/L (ref 0–44)
Albumin/Globulin Ratio: 1.6 (ref 1.2–2.2)
Albumin: 4.6 g/dL (ref 3.8–4.9)
Alkaline Phosphatase: 80 IU/L (ref 44–121)
BUN/Creatinine Ratio: 16 (ref 9–20)
BUN: 16 mg/dL (ref 6–24)
eGFR: 90 mL/min/{1.73_m2} (ref >59)

## 2023-01-25 LAB — HEMOGLOBIN A1C: Hgb A1c MFr Bld: 5.9 % — ABNORMAL HIGH (ref 4.8–5.6)

## 2023-01-25 LAB — PSA: Prostate Specific Ag, Serum: 1.9 ng/mL (ref 0.0–4.0)

## 2023-01-25 NOTE — Telephone Encounter (Signed)
Requested Prescriptions  Pending Prescriptions Disp Refills   omeprazole (PRILOSEC) 40 MG capsule [Pharmacy Med Name: OMEPRAZOLE DR CAPS 40MG ] 90 capsule 0    Sig: TAKE 1 CAPSULE DAILY     Gastroenterology: Proton Pump Inhibitors Failed - 01/24/2023  2:32 PM      Failed - Valid encounter within last 12 months    Recent Outpatient Visits           Yesterday Acute recurrent frontal sinusitis   [place] [facility] Alfredia Ferguson, PA-C   1 year ago Intractable migraine with aura with status migrainosus   [facility] Alfredia Ferguson, PA-C   1 year ago Annual physical exam   [facility] Caro Laroche, DO   2 years ago Elevated BP without diagnosis of hypertension   [facility], Marzella Schlein, MD   2 years ago Seasonal allergic rhinitis due to pollen   [facility] Sherrie Mustache, Demetrios Isaacs, MD

## 2023-01-28 LAB — CBC WITH DIFFERENTIAL/PLATELET
EOS (ABSOLUTE): 0 10*3/uL (ref 0.0–0.4)
Lymphocytes Absolute: 1.4 10*3/uL (ref 0.7–3.1)
MCH: 31.7 pg (ref 26.6–33.0)
MCV: 93 fL (ref 79–97)
Monocytes Absolute: 0.5 10*3/uL (ref 0.1–0.9)
Monocytes: 8 %
Neutrophils Absolute: 4.1 10*3/uL (ref 1.4–7.0)
Neutrophils: 66 %
Platelets: 273 10*3/uL (ref 150–450)
WBC: 6 10*3/uL (ref 3.4–10.8)

## 2023-01-28 LAB — COMPREHENSIVE METABOLIC PANEL
AST: 20 IU/L (ref 0–40)
Bilirubin Total: 0.5 mg/dL (ref 0.0–1.2)
CO2: 22 mmol/L (ref 20–29)
Calcium: 10.2 mg/dL (ref 8.7–10.2)
Chloride: 103 mmol/L (ref 96–106)
Creatinine, Ser: 1 mg/dL (ref 0.76–1.27)
Globulin, Total: 2.8 g/dL (ref 1.5–4.5)
Glucose: 103 mg/dL — ABNORMAL HIGH (ref 70–99)
Potassium: 4.4 mmol/L (ref 3.5–5.2)
Sodium: 141 mmol/L (ref 134–144)
Total Protein: 7.4 g/dL (ref 6.0–8.5)

## 2023-01-28 LAB — HEMOGLOBIN A1C: Est. average glucose Bld gHb Est-mCnc: 123 mg/dL

## 2023-01-28 LAB — TESTOSTERONE,FREE AND TOTAL
Testosterone, Free: 9.1 pg/mL (ref 7.2–24.0)
Testosterone: 276 ng/dL (ref 264–916)

## 2023-01-28 LAB — TSH+FREE T4
Free T4: 1.13 ng/dL (ref 0.82–1.77)
TSH: 1.78 u[IU]/mL (ref 0.450–4.500)

## 2023-01-29 ENCOUNTER — Other Ambulatory Visit: Payer: Self-pay | Admitting: Physician Assistant

## 2023-01-29 ENCOUNTER — Telehealth: Payer: Self-pay

## 2023-01-29 ENCOUNTER — Telehealth: Payer: Self-pay | Admitting: Physician Assistant

## 2023-01-29 DIAGNOSIS — J301 Allergic rhinitis due to pollen: Secondary | ICD-10-CM | POA: Diagnosis not present

## 2023-01-29 DIAGNOSIS — R7989 Other specified abnormal findings of blood chemistry: Secondary | ICD-10-CM

## 2023-01-29 NOTE — Telephone Encounter (Signed)
Pt is calling to receive his lab results. Please advise CB- [phone]

## 2023-01-29 NOTE — Telephone Encounter (Signed)
Copied from CRM [id number]. Topic: Referral - Request for Referral >> Jan 29, 2023 10:13 AM Everette C wrote: Has patient seen PCP for this complaint? Yes.   *If NO, is insurance requiring patient see PCP for this issue before PCP can refer them? Referral for which specialty: Urology  Preferred provider/office: Patient has no preference  Reason for referral: Testosterone concern

## 2023-01-29 NOTE — Telephone Encounter (Signed)
Referral sent 

## 2023-02-05 DIAGNOSIS — J301 Allergic rhinitis due to pollen: Secondary | ICD-10-CM | POA: Diagnosis not present

## 2023-02-07 ENCOUNTER — Ambulatory Visit: Payer: Self-pay

## 2023-02-07 NOTE — Telephone Encounter (Signed)
Patient called, left VM to return the call to the office to speak to the NT.   Summary: pain an pressure on left side   Pt called in says has pain and pressure on his left side

## 2023-02-07 NOTE — Telephone Encounter (Signed)
2nd attempt, Patient called, left VM to return the call to the office to speak to the NT.

## 2023-02-07 NOTE — Telephone Encounter (Signed)
Ok noted not much to do if pt wont call back/answer

## 2023-02-07 NOTE — Telephone Encounter (Signed)
3rd attempt, Patient called, left VM to return the call to the office to speak to the NT. Unable to reach patient after 3 attempts by [facility] NT, routing to the provider for resolution per protocol.

## 2023-02-19 DIAGNOSIS — J301 Allergic rhinitis due to pollen: Secondary | ICD-10-CM | POA: Diagnosis not present

## 2023-02-19 DIAGNOSIS — F331 Major depressive disorder, recurrent, moderate: Secondary | ICD-10-CM | POA: Diagnosis not present

## 2023-02-26 DIAGNOSIS — J301 Allergic rhinitis due to pollen: Secondary | ICD-10-CM | POA: Diagnosis not present

## 2023-02-27 DIAGNOSIS — J301 Allergic rhinitis due to pollen: Secondary | ICD-10-CM | POA: Diagnosis not present

## 2023-03-05 DIAGNOSIS — J301 Allergic rhinitis due to pollen: Secondary | ICD-10-CM | POA: Diagnosis not present

## 2023-03-12 DIAGNOSIS — J301 Allergic rhinitis due to pollen: Secondary | ICD-10-CM | POA: Diagnosis not present

## 2023-03-15 DIAGNOSIS — H3562 Retinal hemorrhage, left eye: Secondary | ICD-10-CM | POA: Diagnosis not present

## 2023-03-15 DIAGNOSIS — H43821 Vitreomacular adhesion, right eye: Secondary | ICD-10-CM | POA: Diagnosis not present

## 2023-03-15 DIAGNOSIS — H33322 Round hole, left eye: Secondary | ICD-10-CM | POA: Diagnosis not present

## 2023-03-23 ENCOUNTER — Ambulatory Visit: Payer: BC Managed Care – PPO | Admitting: Urology

## 2023-03-23 ENCOUNTER — Encounter: Payer: Self-pay | Admitting: Urology

## 2023-03-23 VITALS — BP 143/92 | HR 81 | Ht 67.0 in | Wt 205.0 lb

## 2023-03-23 DIAGNOSIS — R7989 Other specified abnormal findings of blood chemistry: Secondary | ICD-10-CM | POA: Diagnosis not present

## 2023-03-23 DIAGNOSIS — E291 Testicular hypofunction: Secondary | ICD-10-CM

## 2023-03-23 NOTE — Progress Notes (Signed)
I, Maysun Anabel Bene, acting as a scribe for Riki Altes, MD., have documented all relevant documentation on the behalf of Riki Altes, MD, as directed by Riki Altes, MD while in the presence of Riki Altes, MD.  03/23/2023 9:37 AM   Caleb Miles 1970-08-30 [id number]  Referring provider: Alfredia Ferguson, PA-C [address] [place],  [place] [address]  Chief Complaint  Patient presents with   Hypogonadism    HPI: Caleb Miles is a 52 y.o. male presents for evaluation of low testosterone.   Several year history of low energy, low motivation, depression, and recently decreased libido.  Denies erectile dysfunction.  Testosterone level low normal at 276 with a low normal free testosterone level at 9.1 He has only had one testosterone level.  PSA 01/24/23 was 1.9 Past urological history remarkable for nephrolithiasis.   PMH: Past Medical History:  Diagnosis Date   ADHD (attention deficit hyperactivity disorder)    Anxiety    Depression    Headache    migraines   History of kidney stones    h/o   Hypothyroidism    Sleep apnea    JUST RECENTLY DX AND HAS NOT SET UP TO GET CPAP YET    Surgical History: Past Surgical History:  Procedure Laterality Date   IMAGE GUIDED SINUS SURGERY N/A 10/31/2018   Procedure: IMAGE GUIDED SINUS SURGERY;  Surgeon: Bud Face, MD;  Location: [facility] ORS;  Service: ENT;  Laterality: N/A;   MAXILLARY ANTROSTOMY Right 10/31/2018   Procedure: Right MAXILLARY ANTROSTOMY without tissue removal;  Surgeon: Bud Face, MD;  Location: [facility] ORS;  Service: ENT;  Laterality: Right;   NASAL SEPTOPLASTY W/ TURBINOPLASTY Bilateral 10/31/2018   Procedure: NASAL SEPTOPLASTY WITH Bilateral InferiorTURBINATE REDUCTION;  Surgeon: Bud Face, MD;  Location: [facility] ORS;  Service: ENT;  Laterality: Bilateral;   TONSILLECTOMY     TONSILLECTOMY AND ADENOIDECTOMY  24    Home Medications:  Allergies as of 03/23/2023   No Known  Allergies      Medication List        Accurate as of March 23, 2023  9:37 AM. If you have any questions, ask your nurse or doctor.          STOP taking these medications    azelastine 0.1 % nasal spray Commonly known as: ASTELIN Stopped by: Riki Altes   benzonatate 100 MG capsule Commonly known as: TESSALON Stopped by: Riki Altes   buPROPion 150 MG 24 hr tablet Commonly known as: WELLBUTRIN XL Stopped by: Riki Altes   cyclobenzaprine 5 MG tablet Commonly known as: FLEXERIL Stopped by: Riki Altes       TAKE these medications    cetirizine 10 MG tablet Commonly known as: ZYRTEC Take 10 mg by mouth daily.   omeprazole 40 MG capsule Commonly known as: PRILOSEC TAKE 1 CAPSULE DAILY   SUMAtriptan 50 MG tablet Commonly known as: Imitrex Take 1 tablet (50 mg total) by mouth daily as needed for migraine. May repeat in 2 hours if headache persists or recurs.   Synthroid 100 MCG tablet Generic drug: levothyroxine Take 100 mcg by mouth daily.   Vitamin D3 1.25 MG (50000 UT) Caps Take by mouth.        Allergies: No Known Allergies  Family History: Family History  Problem Relation Age of Onset   Emphysema Paternal Grandfather     Social History:  reports that he has never smoked. He has never  used smokeless tobacco. He reports that he does not drink alcohol and does not use drugs.   Physical Exam: BP (!) 143/92   Pulse 81   Ht 5\' 7"  (1.702 m)   Wt 205 lb (93 kg)   BMI 32.11 kg/m   Constitutional:  Alert and oriented, No acute distress. HEENT: [place] AT Respiratory: Normal respiratory effort GU: Phallus circumcised without lesions, testes descended bilateral without masses or tenderness. Estimated volume approximately 20 cc bilaterally. Psychiatric: Normal mood and affect.   Assessment & Plan:    1. Hypogonadism We discussed the diagnosis of testosterone is based on 2 abnormal total testosterone levels drawn in the a.m. admitted  with signs and symptoms of low testosterone. We discussed various forms of testosterone placement including topical preparations, intramuscular injections, subcutaneous injections, subcutaneous pellet implantation and oral testosterone.  Pros and cons of each form were discussed.  The risk of transference of topical testosterone. I had an extensive discussion regarding testosterone replacement therapy including the following: Treatment may result in improvements in erectile function, low sex drive, anemia, bone mineral density, lean body mass, and depressive symptoms; evidence is inconclusive whether testosterone therapy improves cognitive function, measures of diabetes, energy, fatigue, lipid profiles, and quality of life measures; there is no conclusive evidence linking testosterone therapy to the development of prostate cancer; there is no definitive evidence linking testosterone therapy to a higher incidence of venothrombolic events; at the present time it cannot be stated definitively whether testosterone therapy increases or decreases the risk of cardiovascular events including myocardial infarction and stroke. Potential side effects were discussed including erythrocytosis, gynecomastia.  The need for regular monitoring of testosterone levels and hematocrit was discussed.  Repeat testosterone level and LH drawn today, we'll call with results.  Although total testosterone levels less than 300 are considered low enough to warrant treatment if symptoms present. We did discussed since level is in the lower range of normal, testosterone replacement may not be covered by his insurance company.  We also discussed the off label use of oral clomid if LH not elevated  I have reviewed the above documentation for accuracy and completeness, and I agree with the above.   Riki Altes, MD  [facility] [address], [address] [place], [place] [address] [phone]

## 2023-03-23 NOTE — Patient Instructions (Signed)
WILL CALL WITH RESULTS 

## 2023-03-25 ENCOUNTER — Encounter: Payer: Self-pay | Admitting: Urology

## 2023-03-26 DIAGNOSIS — J301 Allergic rhinitis due to pollen: Secondary | ICD-10-CM | POA: Diagnosis not present

## 2023-04-02 DIAGNOSIS — J301 Allergic rhinitis due to pollen: Secondary | ICD-10-CM | POA: Diagnosis not present

## 2023-04-10 ENCOUNTER — Other Ambulatory Visit: Payer: Self-pay | Admitting: Physician Assistant

## 2023-04-10 ENCOUNTER — Telehealth: Payer: Self-pay

## 2023-04-10 DIAGNOSIS — E039 Hypothyroidism, unspecified: Secondary | ICD-10-CM

## 2023-04-10 DIAGNOSIS — J301 Allergic rhinitis due to pollen: Secondary | ICD-10-CM

## 2023-04-10 NOTE — Telephone Encounter (Signed)
Patient called in today and states that he would like to try the Testerone Replacement that was suggested at his visit.

## 2023-04-10 NOTE — Telephone Encounter (Unsigned)
Copied from CRM [id number]. Topic: General - Other >> Apr 10, 2023 10:00 AM Everette C wrote: Reason for CRM: Medication Refill - Medication: SYNTHROID 100 MCG tablet [[id number]]  azelastine (ASTELIN) 0.1 % nasal spray [[id number]]   Has the patient contacted their pharmacy? Yes.   (Agent: If no, request that the patient contact the pharmacy for the refill. If patient does not wish to contact the pharmacy document the reason why and proceed with request.) (Agent: If yes, when and what did the pharmacy advise?)  Preferred Pharmacy (with phone number or street name): [facility] - [place], [place] - [address] [place] [address] Phone: [phone] Fax: [phone] Hours: Not open 24 hours   Has the patient been seen for an appointment in the last year OR does the patient have an upcoming appointment? Yes.    Agent: Please be advised that RX refills may take up to 3 business days. We ask that you follow-up with your pharmacy.

## 2023-04-11 ENCOUNTER — Encounter: Payer: Self-pay | Admitting: *Deleted

## 2023-04-11 NOTE — Telephone Encounter (Signed)
Requested medication (s) are due for refill today - needs review   Requested medication (s) are on the active medication list - no/yes  Future visit scheduled -no  Last refill: synthroid 05/14/21- listed as historical                 Astelin- no longer listed on current medication list   Notes to clinic: see above  Requested Prescriptions  Pending Prescriptions Disp Refills   SYNTHROID 100 MCG tablet      Sig: Take 1 tablet (100 mcg total) by mouth daily.     Endocrinology:  Hypothyroid Agents Failed - 04/10/2023 10:26 AM      Failed - Valid encounter within last 12 months    Recent Outpatient Visits           2 months ago Acute recurrent frontal sinusitis   [place] [facility] Alfredia Ferguson, PA-C   1 year ago Intractable migraine with aura with status migrainosus   [place] [facility] Alfredia Ferguson, PA-C   1 year ago Annual physical exam   [facility] Caro Laroche, DO   2 years ago Elevated BP without diagnosis of hypertension   [place] [facility], Marzella Schlein, MD   2 years ago Seasonal allergic rhinitis due to pollen   [facility] Sherrie Mustache, Demetrios Isaacs, MD              Passed - TSH in normal range and within 360 days    TSH  Date Value Ref Range Status  01/24/2023 1.780 0.450 - 4.500 uIU/mL Final          azelastine (ASTELIN) 0.1 % nasal spray 30 mL 4    Sig: Place 2 sprays into both nostrils 2 (two) times daily.     Ear, Nose, and Throat: Nasal Preparations - Antiallergy Failed - 04/10/2023 10:26 AM      Failed - Valid encounter within last 12 months    Recent Outpatient Visits           2 months ago Acute recurrent frontal sinusitis   [place] [facility] Alfredia Ferguson, PA-C   1 year ago Intractable migraine with aura with status migrainosus   [place] [facility] Alfredia Ferguson, PA-C   1 year ago  Annual physical exam   [facility] Caro Laroche, DO   2 years ago Elevated BP without diagnosis of hypertension   [place] [facility] [place], Marzella Schlein, MD   2 years ago Seasonal allergic rhinitis due to pollen   [place] Malva Limes, MD                 Requested Prescriptions  Pending Prescriptions Disp Refills   SYNTHROID 100 MCG tablet      Sig: Take 1 tablet (100 mcg total) by mouth daily.     Endocrinology:  Hypothyroid Agents Failed - 04/10/2023 10:26 AM      Failed - Valid encounter within last 12 months    Recent Outpatient Visits           2 months ago Acute recurrent frontal sinusitis   [place] [facility] Alfredia Ferguson, PA-C   1 year ago Intractable migraine with aura with status migrainosus   [facility] Alfredia Ferguson, PA-C   1 year ago Annual physical exam     [facility] [place], [place], DO   2 years ago Elevated BP without diagnosis of hypertension   [place] [facility] [place], Marzella Schlein, MD   2 years ago Seasonal allergic rhinitis due to pollen   [facility] Sherrie Mustache, Demetrios Isaacs, MD              Passed - TSH in normal range and within 360 days    TSH  Date Value Ref Range Status  01/24/2023 1.780 0.450 - 4.500 uIU/mL Final          azelastine (ASTELIN) 0.1 % nasal spray 30 mL 4    Sig: Place 2 sprays into both nostrils 2 (two) times daily.     Ear, Nose, and Throat: Nasal Preparations - Antiallergy Failed - 04/10/2023 10:26 AM      Failed - Valid encounter within last 12 months    Recent Outpatient Visits           2 months ago Acute recurrent frontal sinusitis   [place] [facility] Alfredia Ferguson, PA-C   1 year ago Intractable migraine with aura with status migrainosus   [place] [facility]  Alfredia Ferguson, PA-C   1 year ago Annual physical exam   [facility] Caro Laroche, DO   2 years ago Elevated BP without diagnosis of hypertension   [place] [facility], Marzella Schlein, MD   2 years ago Seasonal allergic rhinitis due to pollen   [facility] Sherrie Mustache, Demetrios Isaacs, MD

## 2023-04-12 MED ORDER — SYNTHROID 100 MCG PO TABS: 100.0000 ug | ORAL_TABLET | Freq: Every day | ORAL | 2 refills | Status: DC

## 2023-04-16 ENCOUNTER — Telehealth: Payer: Self-pay | Admitting: *Deleted

## 2023-04-16 ENCOUNTER — Telehealth: Payer: Self-pay | Admitting: Physician Assistant

## 2023-04-16 DIAGNOSIS — J301 Allergic rhinitis due to pollen: Secondary | ICD-10-CM | POA: Diagnosis not present

## 2023-04-16 NOTE — Telephone Encounter (Signed)
Patient states he would like to start on the gel. He is going to use cover my meds. Per patient. I advised patient that the gel could be costly .

## 2023-04-16 NOTE — Telephone Encounter (Addendum)
Annabelle Harman with [facility] pharmacy has called about a fax that was sent over on 04/12/2023 in regards to patients SYNTHROID medication, please advise and call Annabelle Harman back @ # [phone].   REF # [id number]     [facility] FAX # : [phone]

## 2023-04-24 MED ORDER — TESTOSTERONE 20.25 MG/ACT (1.62%) TD GEL: TRANSDERMAL | 1 refills | Status: DC

## 2023-04-24 NOTE — Addendum Note (Signed)
Addended by: Riki Altes on: 04/24/2023 09:32 PM   Modules accepted: Orders

## 2023-04-25 ENCOUNTER — Other Ambulatory Visit: Payer: Self-pay | Admitting: *Deleted

## 2023-04-25 ENCOUNTER — Encounter: Payer: Self-pay | Admitting: *Deleted

## 2023-04-25 MED ORDER — TESTOSTERONE 20.25 MG/ACT (1.62%) TD GEL: TRANSDERMAL | 1 refills | Status: DC

## 2023-04-25 NOTE — Telephone Encounter (Signed)
Sent mychart message to patient

## 2023-05-16 DIAGNOSIS — J301 Allergic rhinitis due to pollen: Secondary | ICD-10-CM | POA: Diagnosis not present

## 2023-09-26 ENCOUNTER — Telehealth: Payer: BC Managed Care – PPO | Admitting: Physician Assistant

## 2023-09-26 DIAGNOSIS — B9689 Other specified bacterial agents as the cause of diseases classified elsewhere: Secondary | ICD-10-CM | POA: Diagnosis not present

## 2023-09-26 DIAGNOSIS — J019 Acute sinusitis, unspecified: Secondary | ICD-10-CM

## 2023-09-26 MED ORDER — AZELASTINE HCL 0.1 % NA SOLN
1.0000 | Freq: Two times a day (BID) | NASAL | 0 refills | Status: AC
Start: 2023-09-26 — End: ?

## 2023-09-26 MED ORDER — AMOXICILLIN-POT CLAVULANATE 875-125 MG PO TABS
1.0000 | ORAL_TABLET | Freq: Two times a day (BID) | ORAL | 0 refills | Status: DC
Start: 2023-09-26 — End: 2023-11-02

## 2023-09-26 NOTE — Progress Notes (Signed)
E-Visit for Sinus Problems  We are sorry that you are not feeling well.  Here is how we plan to help!  Based on what you have shared with me it looks like you have sinusitis.  Sinusitis is inflammation and infection in the sinus cavities of the head.  Based on your presentation I believe you most likely have Acute Bacterial Sinusitis.  This is an infection caused by bacteria and is treated with antibiotics. I have prescribed Augmentin 875mg /125mg  one tablet twice daily with food, for 7 days. You may use an oral decongestant such as Mucinex D or if you have glaucoma or high blood pressure use plain Mucinex. Saline nasal spray help and can safely be used as often as needed for congestion.  If you develop worsening sinus pain, fever or notice severe headache and vision changes, or if symptoms are not better after completion of antibiotic, please schedule an appointment with a health care provider.    I have also sent in a script for the Astelin spray to use as directed.   Sinus infections are not as easily transmitted as other respiratory infection, however we still recommend that you avoid close contact with loved ones, especially the very young and elderly.  Remember to wash your hands thoroughly throughout the day as this is the number one way to prevent the spread of infection!  Home Care: Only take medications as instructed by your medical team. Complete the entire course of an antibiotic. Do not take these medications with alcohol. A steam or ultrasonic humidifier can help congestion.  You can place a towel over your head and breathe in the steam from hot water coming from a faucet. Avoid close contacts especially the very young and the elderly. Cover your mouth when you cough or sneeze. Always remember to wash your hands.  Get Help Right Away If: You develop worsening fever or sinus pain. You develop a severe head ache or visual changes. Your symptoms persist after you have completed your  treatment plan.  Make sure you Understand these instructions. Will watch your condition. Will get help right away if you are not doing well or get worse.  Thank you for choosing an e-visit.  Your e-visit answers were reviewed by a board certified advanced clinical practitioner to complete your personal care plan. Depending upon the condition, your plan could have included both over the counter or prescription medications.  Please review your pharmacy choice. Make sure the pharmacy is open so you can pick up prescription now. If there is a problem, you may contact your provider through [employer] and have the prescription routed to another pharmacy.  Your safety is important to [place]. If you have drug allergies check your prescription carefully.   For the next 24 hours you can use MyChart to ask questions about today's visit, request a non-urgent call back, or ask for a work or school excuse. You will get an email in the next two days asking about your experience. I hope that your e-visit has been valuable and will speed your recovery.

## 2023-09-26 NOTE — Progress Notes (Signed)
I have spent 5 minutes in review of e-visit questionnaire, review and updating patient chart, medical decision making and response to patient.   Piedad Climes, PA-C

## 2023-10-25 ENCOUNTER — Telehealth: Payer: BC Managed Care – PPO | Admitting: Physician Assistant

## 2023-10-25 DIAGNOSIS — J329 Chronic sinusitis, unspecified: Secondary | ICD-10-CM

## 2023-10-25 NOTE — Progress Notes (Signed)
  Because of recurring sinus symptoms within a few weeks from last treatment, I feel your condition warrants further evaluation and I recommend that you be seen in a face-to-face visit.   NOTE: There will be NO CHARGE for this E-Visit   If you are having a true medical emergency, please call 911.     For an urgent face to face visit, [facility] has multiple urgent care centers for your convenience.  Click the link below for the full list of locations and hours, walk-in wait times, appointment scheduling options and driving directions:  Urgent Care - [place], [place], [place], [place], [place], [place]  [place]      Your MyChart E-visit questionnaire answers were reviewed by a board certified advanced clinical practitioner to complete your personal care plan based on your specific symptoms.    Thank you for using e-Visits.

## 2023-10-26 ENCOUNTER — Ambulatory Visit
Admission: RE | Admit: 2023-10-26 | Discharge: 2023-10-26 | Disposition: A | Discharge status: Home / Self Care | Payer: BC Managed Care – PPO | Source: Ambulatory Visit | Attending: Emergency Medicine | Admitting: Emergency Medicine

## 2023-10-26 VITALS — BP 143/94 | HR 113 | Temp 98.8°F | Resp 16

## 2023-10-26 DIAGNOSIS — J0100 Acute maxillary sinusitis, unspecified: Secondary | ICD-10-CM

## 2023-10-26 LAB — POC COVID19/FLU A&B COMBO
Covid Antigen, POC: NEGATIVE
Influenza A Antigen, POC: NEGATIVE
Influenza B Antigen, POC: NEGATIVE

## 2023-10-26 MED ORDER — DOXYCYCLINE HYCLATE 100 MG PO CAPS: 100.0000 mg | ORAL_CAPSULE | Freq: Two times a day (BID) | ORAL | 0 refills | Status: DC

## 2023-10-26 MED ORDER — PREDNISONE 10 MG (21) PO TBPK: ORAL_TABLET | Freq: Every day | ORAL | 0 refills | Status: DC

## 2023-10-26 NOTE — ED Triage Notes (Addendum)
 Pt presents with c/o HA, nasal congesiton and bilateral ear pain X 3 days. Pt states he started coughing this morning and has developed a scratchy throat. Pt has taken Mucinex, tylenol and Zyrtec

## 2023-10-26 NOTE — Discharge Instructions (Signed)
 Begin doxycycline twice daily for 10 days to clear any germs contributing to your symptoms  Begin prednisone every morning with food as directed to help reduce sinus pressure    You can take Tylenol and/or Ibuprofen as needed for fever reduction and pain relief.   For cough: honey 1/2 to 1 teaspoon (you can dilute the honey in water or another fluid).  You can also use guaifenesin and dextromethorphan for cough. You can use a humidifier for chest congestion and cough.  If you don't have a humidifier, you can sit in the bathroom with the hot shower running.      For sore throat: try warm salt water gargles, cepacol lozenges, throat spray, warm tea or water with lemon/honey, popsicles or ice, or OTC cold relief medicine for throat discomfort.   For congestion: take a daily anti-histamine like Zyrtec, Claritin, and a oral decongestant, such as pseudoephedrine.  You can also use Flonase 1-2 sprays in each nostril daily.   It is important to stay hydrated: drink plenty of fluids (water, gatorade/powerade/pedialyte, juices, or teas) to keep your throat moisturized and help further relieve irritation/discomfort.

## 2023-10-26 NOTE — ED Provider Notes (Signed)
 Caleb Miles    CSN: [id number] Arrival date & time: 10/26/23  1201      History   Chief Complaint Chief Complaint  Patient presents with   Headache    Very congested, sinus drainage, sides of neck are tender, ear pain - Entered by patient    HPI Caleb Miles is a 53 y.o. male.   Patient presents for evaluation of nasal congestion, sinus pressure across the cheeks, intermittent headaches, bilateral ear itching, tenderness along the neck, postnasal drip and sore throat present for 3 days.  History of reoccurring sinusitis allergies.  Has attempted use of Mucinex DM, Tylenol and Zyrtec.  Recent sinusitis.  Past Medical History:  Diagnosis Date   ADHD (attention deficit hyperactivity disorder)    Anxiety    Depression    Headache    migraines   History of kidney stones    h/o   Hypothyroidism    Sleep apnea    JUST RECENTLY DX AND HAS NOT SET UP TO GET CPAP YET    Patient Active Problem List   Diagnosis Date Noted   Elevated BP without diagnosis of hypertension 09/10/2020   Class 1 obesity without serious comorbidity with body mass index (BMI) of 31.0 to 31.9 in adult 09/10/2020   Acquired hypothyroidism 09/10/2020   OSA (obstructive sleep apnea) 09/13/2018   Family history of hemochromatosis 02/13/2017   ADD (attention deficit disorder) 09/20/2015   Migraine with aura 06/28/2015   Depression, major, single episode 11/19/2014   Allergic rhinitis 11/24/2009   Anxiety disorder 06/01/2006    Past Surgical History:  Procedure Laterality Date   IMAGE GUIDED SINUS SURGERY N/A 10/31/2018   Procedure: IMAGE GUIDED SINUS SURGERY;  Surgeon: Bud Face, MD;  Location: [facility];  Service: ENT;  Laterality: N/A;   MAXILLARY ANTROSTOMY Right 10/31/2018   Procedure: Right MAXILLARY ANTROSTOMY without tissue removal;  Surgeon: Bud Face, MD;  Location: [facility];  Service: ENT;  Laterality: Right;   NASAL SEPTOPLASTY W/ TURBINOPLASTY Bilateral 10/31/2018    Procedure: NASAL SEPTOPLASTY WITH Bilateral InferiorTURBINATE REDUCTION;  Surgeon: Bud Face, MD;  Location: [facility];  Service: ENT;  Laterality: Bilateral;   TONSILLECTOMY     TONSILLECTOMY AND ADENOIDECTOMY  24       Home Medications    Prior to Admission medications   Medication Sig Start Date End Date Taking? Authorizing Provider  doxycycline (VIBRAMYCIN) 100 MG capsule Take 1 capsule (100 mg total) by mouth 2 (two) times daily. 10/26/23  Yes Courtland Coppa R, NP  predniSONE (STERAPRED UNI-PAK 21 TAB) 10 MG (21) TBPK tablet Take by mouth daily. Take 6 tabs by mouth daily  for 1 days, then 5 tabs for 1 days, then 4 tabs for 1 days, then 3 tabs for 1 days, 2 tabs for 1 days, then 1 tab by mouth daily for 1 days 10/26/23  Yes Mc Hollen R, NP  amoxicillin-clavulanate (AUGMENTIN) 875-125 MG tablet Take 1 tablet by mouth 2 (two) times daily. 09/26/23   Waldon Merl, PA-C  azelastine (ASTELIN) 0.1 % nasal spray Place 1 spray into both nostrils 2 (two) times daily. Use in each nostril as directed 09/26/23   Waldon Merl, PA-C  cetirizine (ZYRTEC) 10 MG tablet Take 10 mg by mouth daily.    [provider]  Cholecalciferol (VITAMIN D3) 1.25 MG (50000 UT) CAPS Take by mouth. 05/14/21   [provider]  omeprazole (PRILOSEC) 40 MG capsule TAKE 1 CAPSULE DAILY 01/25/23   Drubel,  Lillia Abed, PA-C  SUMAtriptan (IMITREX) 50 MG tablet Take 1 tablet (50 mg total) by mouth daily as needed for migraine. May repeat in 2 hours if headache persists or recurs. 10/06/21   Alfredia Ferguson, PA-C  SYNTHROID 100 MCG tablet Take 1 tablet (100 mcg total) by mouth daily. Take 100 mcg by mouth daily. 04/12/23   Simmons-Robinson, Tawanna Cooler, MD  Testosterone 20.25 MG/ACT (1.62%) GEL 1 pump applied to each shoulder daily 04/25/23   Riki Altes, MD    Family History Family History  Problem Relation Age of Onset   Emphysema Paternal Grandfather     Social History Social History    Tobacco Use   Smoking status: Never   Smokeless tobacco: Never  Vaping Use   Vaping status: Never Used  Substance Use Topics   Alcohol use: No   Drug use: No     Allergies   Patient has no known allergies.   Review of Systems Review of Systems   Physical Exam Triage Vital Signs ED Triage Vitals  Encounter Vitals Group     BP 10/26/23 1215 (!) 143/94     Systolic BP Percentile --      Diastolic BP Percentile --      Pulse Rate 10/26/23 1215 (!) 113     Resp 10/26/23 1215 16     Temp 10/26/23 1215 98.8 F (37.1 C)     Temp Source 10/26/23 1215 Oral     SpO2 10/26/23 1215 97 %     Weight --      Height --      Head Circumference --      Peak Flow --      Pain Score 10/26/23 1214 5     Pain Loc --      Pain Education --      Exclude from Growth Chart --    No data found.  Updated Vital Signs BP (!) 143/94 (BP Location: Right Arm)   Pulse (!) 113   Temp 98.8 F (37.1 C) (Oral)   Resp 16   SpO2 97%   Visual Acuity Right Eye Distance:   Left Eye Distance:   Bilateral Distance:    Right Eye Near:   Left Eye Near:    Bilateral Near:     Physical Exam Constitutional:      Appearance: Normal appearance.  HENT:     Head: Normocephalic.     Right Ear: Tympanic membrane, ear canal and external ear normal.     Left Ear: Tympanic membrane, ear canal and external ear normal.     Nose: Congestion present. No rhinorrhea.     Right Sinus: Maxillary sinus tenderness present.     Left Sinus: Maxillary sinus tenderness present.     Mouth/Throat:     Mouth: Mucous membranes are moist.     Pharynx: Oropharynx is clear.  Eyes:     Extraocular Movements: Extraocular movements intact.  Cardiovascular:     Rate and Rhythm: Normal rate and regular rhythm.     Pulses: Normal pulses.     Heart sounds: Normal heart sounds.  Pulmonary:     Effort: Pulmonary effort is normal.     Breath sounds: Normal breath sounds.  Musculoskeletal:     Cervical back: Normal range  of motion and neck supple.  Neurological:     Mental Status: He is alert and oriented to person, place, and time. Mental status is at baseline.      UC Treatments / Results  Labs (all labs ordered are listed, but only abnormal results are displayed) Labs Reviewed  POC COVID19/FLU A&B COMBO    EKG   Radiology No results found.  Procedures Procedures (including critical care time)  Medications Ordered in UC Medications - No data to display  Initial Impression / Assessment and Plan / UC Course  I have reviewed the triage vital signs and the nursing notes.  Pertinent labs & imaging results that were available during my care of the patient were reviewed by me and considered in my medical decision making (see chart for details).  Acute nonrecurrent maxillary sinusitis  Patient is in no signs of distress nor toxic appearing.  Vital signs are stable.  Low suspicion for pneumonia, pneumothorax or bronchitis and therefore will defer imaging.  Presentation and symptomology consistent with sinusitis, reoccurring history therefore initiating antibiotics, prescribed doxycycline and prednisone.May use additional over-the-counter medications as needed for supportive care.May follow-up with urgent care as needed if symptoms persist or worsen.  Note given.    Final Clinical Impressions(s) / UC Diagnoses   Final diagnoses:  Acute non-recurrent maxillary sinusitis     Discharge Instructions      Begin doxycycline twice daily for 10 days to clear any germs contributing to your symptoms  Begin prednisone every morning with food as directed to help reduce sinus pressure    You can take Tylenol and/or Ibuprofen as needed for fever reduction and pain relief.   For cough: honey 1/2 to 1 teaspoon (you can dilute the honey in water or another fluid).  You can also use guaifenesin and dextromethorphan for cough. You can use a humidifier for chest congestion and cough.  If you don't have a  humidifier, you can sit in the bathroom with the hot shower running.      For sore throat: try warm salt water gargles, cepacol lozenges, throat spray, warm tea or water with lemon/honey, popsicles or ice, or OTC cold relief medicine for throat discomfort.   For congestion: take a daily anti-histamine like Zyrtec, Claritin, and a oral decongestant, such as pseudoephedrine.  You can also use Flonase 1-2 sprays in each nostril daily.   It is important to stay hydrated: drink plenty of fluids (water, gatorade/powerade/pedialyte, juices, or teas) to keep your throat moisturized and help further relieve irritation/discomfort.    ED Prescriptions     Medication Sig Dispense Auth. Provider   doxycycline (VIBRAMYCIN) 100 MG capsule Take 1 capsule (100 mg total) by mouth 2 (two) times daily. 20 capsule Labib Cwynar R, NP   predniSONE (STERAPRED UNI-PAK 21 TAB) 10 MG (21) TBPK tablet Take by mouth daily. Take 6 tabs by mouth daily  for 1 days, then 5 tabs for 1 days, then 4 tabs for 1 days, then 3 tabs for 1 days, 2 tabs for 1 days, then 1 tab by mouth daily for 1 days 21 tablet Sharla Tankard, Elita Boone, NP      PDMP not reviewed this encounter.   Valinda Hoar, NP 10/26/23 1301

## 2023-10-29 ENCOUNTER — Other Ambulatory Visit: Payer: Self-pay | Admitting: Urology

## 2023-10-29 ENCOUNTER — Telehealth: Admitting: Family Medicine

## 2023-10-29 DIAGNOSIS — R053 Chronic cough: Secondary | ICD-10-CM | POA: Diagnosis not present

## 2023-10-29 MED ORDER — ALBUTEROL SULFATE HFA 108 (90 BASE) MCG/ACT IN AERS
1.0000 | INHALATION_SPRAY | RESPIRATORY_TRACT | 0 refills | Status: AC | PRN
Start: 2023-10-29 — End: ?

## 2023-10-29 MED ORDER — BENZONATATE 100 MG PO CAPS
100.0000 mg | ORAL_CAPSULE | Freq: Three times a day (TID) | ORAL | 0 refills | Status: DC | PRN
Start: 2023-10-29 — End: 2024-05-02

## 2023-10-29 NOTE — Progress Notes (Signed)
 E-Visit for Cough   We are sorry that you are not feeling well.  Here is how we plan to help!  Based on your presentation I believe you most likely have an on going cough due to drainage and congestion.    In addition you may use A prescription cough medication called Tessalon Perles 100mg . You may take 1-2 capsules every 8 hours as needed for your cough.  Albuterol inhaler use as directed on packaging.   From your responses in the eVisit questionnaire you describe inflammation in the upper respiratory tract which is causing a significant cough.  This is commonly called Bronchitis and has four common causes:   Allergies Viral Infections Acid Reflux Bacterial Infection Allergies, viruses and acid reflux are treated by controlling symptoms or eliminating the cause. An example might be a cough caused by taking certain blood pressure medications. You stop the cough by changing the medication. Another example might be a cough caused by acid reflux. Controlling the reflux helps control the cough.  USE OF BRONCHODILATOR ("RESCUE") INHALERS: There is a risk from using your bronchodilator too frequently.  The risk is that over-reliance on a medication which only relaxes the muscles surrounding the breathing tubes can reduce the effectiveness of medications prescribed to reduce swelling and congestion of the tubes themselves.  Although you feel brief relief from the bronchodilator inhaler, your asthma may actually be worsening with the tubes becoming more swollen and filled with mucus.  This can delay other crucial treatments, such as oral steroid medications. If you need to use a bronchodilator inhaler daily, several times per day, you should discuss this with your provider.  There are probably better treatments that could be used to keep your asthma under control.     HOME CARE Only take medications as instructed by your medical team. Complete the entire course of an antibiotic. Drink plenty of  fluids and get plenty of rest. Avoid close contacts especially the very young and the elderly Cover your mouth if you cough or cough into your sleeve. Always remember to wash your hands A steam or ultrasonic humidifier can help congestion.   GET HELP RIGHT AWAY IF: You develop worsening fever. You become short of breath You cough up blood. Your symptoms persist after you have completed your treatment plan MAKE SURE YOU  Understand these instructions. Will watch your condition. Will get help right away if you are not doing well or get worse.    Thank you for choosing an e-visit.  Your e-visit answers were reviewed by a board certified advanced clinical practitioner to complete your personal care plan. Depending upon the condition, your plan could have included both over the counter or prescription medications.  Please review your pharmacy choice. Make sure the pharmacy is open so you can pick up prescription now. If there is a problem, you may contact your provider through [employer] and have the prescription routed to another pharmacy.  Your safety is important to [place]. If you have drug allergies check your prescription carefully.   For the next 24 hours you can use MyChart to ask questions about today's visit, request a non-urgent call back, or ask for a work or school excuse. You will get an email in the next two days asking about your experience. I hope that your e-visit has been valuable and will speed your recovery.  I provided 5 minutes of non face-to-face time during this encounter for chart review, medication and order placement, as well as and  documentation.

## 2023-10-30 ENCOUNTER — Telehealth: Payer: Self-pay | Admitting: Urology

## 2023-10-30 NOTE — Telephone Encounter (Signed)
 Testosterone gel Rx was sent August 2024.  It does not look like it was ever started.  Please check with the patient to see if he ever started the testosterone gel and if so is he presently using?

## 2023-10-31 NOTE — Telephone Encounter (Signed)
Left message to return the call

## 2023-11-01 ENCOUNTER — Encounter: Payer: Self-pay | Admitting: *Deleted

## 2023-11-01 DIAGNOSIS — E291 Testicular hypofunction: Secondary | ICD-10-CM

## 2023-11-01 NOTE — Telephone Encounter (Signed)
 Called patient a couple of time. Sent my chart message to day .

## 2023-11-02 ENCOUNTER — Other Ambulatory Visit: Payer: Self-pay | Admitting: Urology

## 2023-11-02 MED ORDER — TESTOSTERONE 20.25 MG/ACT (1.62%) TD GEL: TRANSDERMAL | 1 refills | Status: DC

## 2023-11-02 NOTE — Addendum Note (Signed)
 Addended by: Consuella Lose on: 11/02/2023 03:55 PM   Modules accepted: Orders

## 2023-11-05 MED ORDER — TESTOSTERONE 20.25 MG/ACT (1.62%) TD GEL: TRANSDERMAL | 1 refills | Status: DC

## 2023-12-18 ENCOUNTER — Other Ambulatory Visit

## 2023-12-18 DIAGNOSIS — E291 Testicular hypofunction: Secondary | ICD-10-CM

## 2023-12-19 LAB — TESTOSTERONE: Testosterone: 495 ng/dL (ref 264–916)

## 2023-12-20 ENCOUNTER — Encounter: Payer: Self-pay | Admitting: *Deleted

## 2023-12-20 ENCOUNTER — Other Ambulatory Visit: Payer: Self-pay | Admitting: *Deleted

## 2023-12-20 DIAGNOSIS — E291 Testicular hypofunction: Secondary | ICD-10-CM

## 2023-12-20 DIAGNOSIS — Z125 Encounter for screening for malignant neoplasm of prostate: Secondary | ICD-10-CM

## 2023-12-20 DIAGNOSIS — R7989 Other specified abnormal findings of blood chemistry: Secondary | ICD-10-CM

## 2024-01-22 ENCOUNTER — Other Ambulatory Visit

## 2024-01-26 ENCOUNTER — Other Ambulatory Visit: Payer: Self-pay | Admitting: Urology

## 2024-03-07 ENCOUNTER — Encounter: Payer: Self-pay | Admitting: Urology

## 2024-04-10 ENCOUNTER — Other Ambulatory Visit: Payer: Self-pay | Admitting: Family Medicine

## 2024-04-10 DIAGNOSIS — E039 Hypothyroidism, unspecified: Secondary | ICD-10-CM

## 2024-04-23 ENCOUNTER — Other Ambulatory Visit

## 2024-04-23 DIAGNOSIS — R7989 Other specified abnormal findings of blood chemistry: Secondary | ICD-10-CM

## 2024-04-23 DIAGNOSIS — Z125 Encounter for screening for malignant neoplasm of prostate: Secondary | ICD-10-CM

## 2024-04-24 LAB — HEMOGLOBIN AND HEMATOCRIT, BLOOD
Hematocrit: 48.1 % (ref 37.5–51.0)
Hemoglobin: 15.7 g/dL (ref 13.0–17.7)

## 2024-04-24 LAB — PSA: Prostate Specific Ag, Serum: 2.5 ng/mL (ref 0.0–4.0)

## 2024-04-24 LAB — TESTOSTERONE: Testosterone: 559 ng/dL (ref 264–916)

## 2024-04-25 ENCOUNTER — Ambulatory Visit: Admitting: Urology

## 2024-05-02 ENCOUNTER — Encounter: Payer: Self-pay | Admitting: Urology

## 2024-05-02 ENCOUNTER — Ambulatory Visit (INDEPENDENT_AMBULATORY_CARE_PROVIDER_SITE_OTHER): Admitting: Urology

## 2024-05-02 VITALS — BP 152/103 | HR 88 | Ht 67.0 in | Wt 210.0 lb

## 2024-05-02 DIAGNOSIS — E291 Testicular hypofunction: Secondary | ICD-10-CM | POA: Diagnosis not present

## 2024-05-02 NOTE — Progress Notes (Signed)
 05/02/2024 8:59 AM   Caleb Miles 1970/12/06 [id number]  Referring provider: Cyndi Shaver, PA-C No address on file  Chief Complaint  Patient presents with   Medical Management of Chronic Issues   Urologic history:  1.  Hypogonadism Initially seen 02/2023-symptoms low energy, low motivation, depression, decreased libido T levels 276, 267 mg/dL; LH 3.8 Started topical testosterone   HPI: Caleb Miles is a 53 y.o. male who presents for follow-up of hypogonadism.  Since starting TRT he has noted significant improvement in his symptoms including better energy, improved motivation, improved mood and good libido No bothersome LUTS Labs 04/23/2024: Testosterone  559 ng/dL, PSA 2.5, Y/Y84.2/51.8  PMH: Past Medical History:  Diagnosis Date   ADHD (attention deficit hyperactivity disorder)    Anxiety    Depression    Headache    migraines   History of kidney stones    h/o   Hypothyroidism    Sleep apnea    JUST RECENTLY DX AND HAS NOT SET UP TO GET CPAP YET    Surgical History: Past Surgical History:  Procedure Laterality Date   IMAGE GUIDED SINUS SURGERY N/A 10/31/2018   Procedure: IMAGE GUIDED SINUS SURGERY;  Surgeon: Milissa Hamming, MD;  Location: [facility];  Service: ENT;  Laterality: N/A;   MAXILLARY ANTROSTOMY Right 10/31/2018   Procedure: Right MAXILLARY ANTROSTOMY without tissue removal;  Surgeon: Milissa Hamming, MD;  Location: [facility];  Service: ENT;  Laterality: Right;   NASAL SEPTOPLASTY W/ TURBINOPLASTY Bilateral 10/31/2018   Procedure: NASAL SEPTOPLASTY WITH Bilateral InferiorTURBINATE REDUCTION;  Surgeon: Milissa Hamming, MD;  Location: [facility];  Service: ENT;  Laterality: Bilateral;   TONSILLECTOMY     TONSILLECTOMY AND ADENOIDECTOMY  24    Home Medications:  Allergies as of 05/02/2024   No Known Allergies      Medication List        Accurate as of May 02, 2024  8:59 AM. If you have any questions, ask your nurse or doctor.           STOP taking these medications    benzonatate  100 MG capsule Commonly known as: TESSALON  Stopped by: Glendia JAYSON Barba   doxycycline  100 MG capsule Commonly known as: VIBRAMYCIN  Stopped by: Glendia JAYSON Barba   predniSONE  10 MG (21) Tbpk tablet Commonly known as: STERAPRED UNI-PAK 21 TAB Stopped by: Glendia JAYSON Barba       TAKE these medications    albuterol  108 (90 Base) MCG/ACT inhaler Commonly known as: VENTOLIN  HFA Inhale 1-2 puffs into the lungs every 4 (four) hours as needed for wheezing or shortness of breath (cough).   azelastine  0.1 % nasal spray Commonly known as: ASTELIN  Place 1 spray into both nostrils 2 (two) times daily. Use in each nostril as directed   cetirizine 10 MG tablet Commonly known as: ZYRTEC Take 10 mg by mouth daily.   omeprazole  40 MG capsule Commonly known as: PRILOSEC TAKE 1 CAPSULE DAILY   SUMAtriptan  50 MG tablet Commonly known as: Imitrex  Take 1 tablet (50 mg total) by mouth daily as needed for migraine. May repeat in 2 hours if headache persists or recurs.   Synthroid  100 MCG tablet Generic drug: levothyroxine Take 1 tablet (100 mcg total) by mouth daily. Take 100 mcg by mouth daily.   Testosterone  20.25 MG/ACT (1.62%) Gel 1 PUMP APPLIED TO EACH SHOULDER DAILY   Vitamin D3 1.25 MG (50000 UT) Caps Take by mouth.        Allergies: No Known Allergies  Family  History: Family History  Problem Relation Age of Onset   Emphysema Paternal Grandfather     Social History:  reports that he has never smoked. He has never used smokeless tobacco. He reports that he does not drink alcohol and does not use drugs.   Physical Exam: BP (!) 152/103   Pulse 88   Ht 5' 7 (1.702 m)   Wt 210 lb (95.3 kg)   SpO2 95%   BMI 32.89 kg/m   Constitutional:  Alert, No acute distress. HEENT: [place] AT Respiratory: Normal respiratory effort, no increased work of breathing. Psychiatric: Normal mood and affect.   Assessment & Plan:    1.   Hypogonadism Doing well on TRT Lab visit 6 months testosterone , H/H Office visit 1 year testosterone , H/H, PSA   Glendia JAYSON Barba, MD  [facility] [address] [place], [place] [address] [phone]

## 2024-05-02 NOTE — Addendum Note (Signed)
 Addended by: GENITA HARLENE CROME on: 05/02/2024 09:04 AM   Modules accepted: Orders

## 2024-06-10 DIAGNOSIS — F3342 Major depressive disorder, recurrent, in full remission: Secondary | ICD-10-CM | POA: Diagnosis not present

## 2024-06-17 ENCOUNTER — Telehealth: Admitting: Physician Assistant

## 2024-06-17 DIAGNOSIS — J019 Acute sinusitis, unspecified: Secondary | ICD-10-CM

## 2024-06-17 DIAGNOSIS — B9689 Other specified bacterial agents as the cause of diseases classified elsewhere: Secondary | ICD-10-CM

## 2024-06-17 MED ORDER — AMOXICILLIN-POT CLAVULANATE 875-125 MG PO TABS: 1.0000 | ORAL_TABLET | Freq: Two times a day (BID) | ORAL | 0 refills | Status: DC

## 2024-06-17 NOTE — Progress Notes (Signed)

## 2024-09-18 ENCOUNTER — Ambulatory Visit

## 2024-09-18 VITALS — BP 130/84 | HR 82 | Temp 98.2°F | Wt 212.8 lb

## 2024-09-18 DIAGNOSIS — Z1211 Encounter for screening for malignant neoplasm of colon: Secondary | ICD-10-CM | POA: Diagnosis not present

## 2024-09-18 DIAGNOSIS — F324 Major depressive disorder, single episode, in partial remission: Secondary | ICD-10-CM

## 2024-09-18 DIAGNOSIS — Z23 Encounter for immunization: Secondary | ICD-10-CM | POA: Diagnosis not present

## 2024-09-18 DIAGNOSIS — Z131 Encounter for screening for diabetes mellitus: Secondary | ICD-10-CM

## 2024-09-18 DIAGNOSIS — Z8349 Family history of other endocrine, nutritional and metabolic diseases: Secondary | ICD-10-CM | POA: Diagnosis not present

## 2024-09-18 DIAGNOSIS — Z136 Encounter for screening for cardiovascular disorders: Secondary | ICD-10-CM | POA: Diagnosis not present

## 2024-09-18 DIAGNOSIS — E039 Hypothyroidism, unspecified: Secondary | ICD-10-CM | POA: Diagnosis not present

## 2024-09-18 MED ORDER — LEVOTHYROXINE SODIUM 100 MCG PO TABS: 100.0000 ug | ORAL_TABLET | Freq: Every day | ORAL | 3 refills | Status: AC

## 2024-09-18 NOTE — Patient Instructions (Signed)
 Return the first week of March for repeat blood work for your thyroid .

## 2024-09-18 NOTE — Progress Notes (Signed)
 "   New patient visit   Patient: Caleb Miles   DOB: 1971/03/03   54 y.o. Male  MRN: [id number] Visit Date: 09/18/2024  Today's healthcare provider: Isaiah DELENA Pepper, MD   Chief Complaint  Patient presents with   Follow-up    Thyroid  levels checked Medication refill   Subjective    Caleb Miles is a 54 y.o. male who presents today as a new patient to establish care.   Discussed the use of AI scribe software for clinical note transcription with the patient, who gave verbal consent to proceed.  History of Present Illness Caleb Miles is a 54 year old male who presents for a refill of his thyroid  medication.  He has been off levothyroxine  100 mcg for a couple of months due to running out of refills. He has been stable on this dose for at least two years. Despite being off the medication, he has not noticed any significant symptoms, although he experiences chronic fatigue and depression, which he attributes to his usual state.  He uses an inhaler as needed for allergies, though infrequently, and takes Zyrtec daily for allergy management. He also takes omeprazole  once a week for acid reflux and sumatriptan  as needed for migraines, which have decreased in frequency to once every two to three months. He uses testosterone  gel as part of his treatment regimen.  He was tested for sleep apnea and was told it was mild, but opted against using CPAP therapy. He considered a dental device but did not pursue it further.  He takes a weekly vitamin D supplement, which was recommended by his psychiatrist after a low vitamin D level was identified. He is also on Wellbutrin for chronic depression and reports being stable on it.  He received a flu vaccine at work in September. He has not undergone colon cancer screening yet. He has a family history of hemochromatosis on his father's side, but he has been checked multiple times with no signs of the condition. His father developed it in his late  fifties to early sixties.   Past Medical History:  Diagnosis Date   ADHD (attention deficit hyperactivity disorder)    Anxiety    Depression    Headache    migraines   History of kidney stones    h/o   Hypothyroidism    Sleep apnea    JUST RECENTLY DX AND HAS NOT SET UP TO GET CPAP YET   Past Surgical History:  Procedure Laterality Date   IMAGE GUIDED SINUS SURGERY N/A 10/31/2018   Procedure: IMAGE GUIDED SINUS SURGERY;  Surgeon: Milissa Hamming, MD;  Location: [facility];  Service: ENT;  Laterality: N/A;   MAXILLARY ANTROSTOMY Right 10/31/2018   Procedure: Right MAXILLARY ANTROSTOMY without tissue removal;  Surgeon: Milissa Hamming, MD;  Location: [facility];  Service: ENT;  Laterality: Right;   NASAL SEPTOPLASTY W/ TURBINOPLASTY Bilateral 10/31/2018   Procedure: NASAL SEPTOPLASTY WITH Bilateral InferiorTURBINATE REDUCTION;  Surgeon: Milissa Hamming, MD;  Location: [facility];  Service: ENT;  Laterality: Bilateral;   TONSILLECTOMY     TONSILLECTOMY AND ADENOIDECTOMY  22   Family Status  Relation Name Status   Mother  Alive   Father  Alive   Sister  Alive   Daughter  Alive   Son  Alive   MGM  Deceased   MGF  Deceased at age 58       black lung   PGM  Deceased   PGF  Deceased   Daughter  Alive   Son  Alive  No partnership data on file   Family History  Problem Relation Age of Onset   Emphysema Paternal Grandfather    Social History   Socioeconomic History   Marital status: Married    Spouse name: Not on file   Number of children: Not on file   Years of education: Not on file   Highest education level: Not on file  Occupational History   Not on file  Tobacco Use   Smoking status: Never   Smokeless tobacco: Never  Vaping Use   Vaping status: Never Used  Substance and Sexual Activity   Alcohol use: No   Drug use: No   Sexual activity: Yes  Other Topics Concern   Not on file  Social History Narrative   Not on file   Social Drivers of Health   Tobacco Use:  Low Risk (05/02/2024)   Patient History    Smoking Tobacco Use: Never    Smokeless Tobacco Use: Never    Passive Exposure: Not on file  Financial Resource Strain: Not on file  Food Insecurity: Not on file  Transportation Needs: Not on file  Physical Activity: Not on file  Stress: Not on file (07/04/2023)  Social Connections: Not on file  Depression (PHQ2-9): Low Risk (09/18/2024)   Depression (PHQ2-9)    PHQ-2 Score: 4  Alcohol Screen: Low Risk (10/06/2021)   Alcohol Screen    Last Alcohol Screening Score (AUDIT): 0  Housing: Not on file  Utilities: Not on file  Health Literacy: Not on file   Show/hide medication list[1] Allergies[2]  Reviews of Systems as noted in HPI.      Objective    BP 130/84   Pulse 82   Temp 98.2 F (36.8 C) (Oral)   Wt 212 lb 12.8 oz (96.5 kg)   SpO2 99%   BMI 33.33 kg/m     Physical Exam Constitutional:      Appearance: Normal appearance.  HENT:     Head: Normocephalic and atraumatic.     Mouth/Throat:     Mouth: Mucous membranes are moist.  Eyes:     Pupils: Pupils are equal, round, and reactive to light.  Pulmonary:     Effort: Pulmonary effort is normal.  Skin:    General: Skin is warm.  Neurological:     General: No focal deficit present.     Mental Status: He is alert.     Depression Screen    09/18/2024    4:57 PM 01/24/2023    3:05 PM 10/06/2021   11:23 AM 08/10/2021    3:04 PM  PHQ 2/9 Scores  PHQ - 2 Score 1 1 0 0  PHQ- 9 Score 4 6  2   0      Data saved with a previous flowsheet row definition   No results found for any visits on 09/18/24.  Assessment & Plan      Problem List Items Addressed This Visit       Endocrine   Acquired hypothyroidism - Primary   Relevant Medications   levothyroxine  (SYNTHROID ) 100 MCG tablet   Other Relevant Orders   TSH     Other   Depression, major, single episode   Chronic, controlled on current regimen. Continue Wellbutrin. Follow up with psychiatry.      Relevant  Medications   buPROPion (WELLBUTRIN XL) 150 MG 24 hr tablet   Family history of hemochromatosis   Patient's father was diagnosed with hemochromatosis around  age 23. Will test for iron, CBC, and CMP.      Relevant Orders   Comprehensive metabolic panel with GFR   Iron, TIBC and Ferritin Panel   CBC   Other Visit Diagnoses       Screening for colon cancer       Relevant Orders   Cologuard     Need for vaccination       Relevant Orders   Tdap vaccine greater than or equal to 7yo IM (Completed)   Pneumococcal conjugate vaccine 20-valent (Completed)     Screening for cardiovascular condition       Relevant Orders   Lipid panel     Screening for diabetes mellitus (DM)       Relevant Orders   Hemoglobin A1c      Assessment & Plan Acquired hypothyroidism Chronic, uncontrolled. Off thyroid  medication for months. Possible low thyroid  levels affecting mood and energy. - Restart Synthroid  100 mcg daily. - Recheck thyroid  levels in 6 weeks for dose adjustment. Will communicate results with patient via MyChart. - Sent prescription to [facility].  Colon cancer screening Overdue for screening. Discussed colonoscopy and Cologuard options.  - Ordered Cologuard test.  Immunization management Due for tetanus and pneumonia vaccines. Discussed COVID and shingles vaccines. - Administered tetanus vaccine. - Administered pneumonia vaccine. - Recommended COVID booster at pharmacy. - Discussed shingles vaccine for future.     Return in about 1 year (around 09/18/2025) for Annual Physical Exam.      Isaiah DELENA Pepper, MD  [facility] [phone] (phone) [phone] (fax)     [1]  Outpatient Medications Prior to Visit  Medication Sig   albuterol  (VENTOLIN  HFA) 108 (90 Base) MCG/ACT inhaler Inhale 1-2 puffs into the lungs every 4 (four) hours as needed for wheezing or shortness of breath (cough).   azelastine  (ASTELIN ) 0.1 % nasal spray Place 1 spray  into both nostrils 2 (two) times daily. Use in each nostril as directed   cetirizine (ZYRTEC) 10 MG tablet Take 10 mg by mouth daily.   Cholecalciferol (VITAMIN D3) 1.25 MG (50000 UT) CAPS Take by mouth.   omeprazole  (PRILOSEC) 40 MG capsule TAKE 1 CAPSULE DAILY   SUMAtriptan  (IMITREX ) 50 MG tablet Take 1 tablet (50 mg total) by mouth daily as needed for migraine. May repeat in 2 hours if headache persists or recurs.   Testosterone  20.25 MG/ACT (1.62%) GEL 1 PUMP APPLIED TO EACH SHOULDER DAILY   [DISCONTINUED] SYNTHROID  100 MCG tablet Take 1 tablet (100 mcg total) by mouth daily. Take 100 mcg by mouth daily.   buPROPion (WELLBUTRIN XL) 150 MG 24 hr tablet Take 450 mg by mouth daily.   [DISCONTINUED] amoxicillin -clavulanate (AUGMENTIN ) 875-125 MG tablet Take 1 tablet by mouth 2 (two) times daily. (Patient not taking: Reported on 09/18/2024)   No facility-administered medications prior to visit.  [2] No Known Allergies  "

## 2024-09-18 NOTE — Assessment & Plan Note (Signed)
 Patient's father was diagnosed with hemochromatosis around age 56. Will test for iron, CBC, and CMP.

## 2024-09-18 NOTE — Assessment & Plan Note (Signed)
 Chronic, controlled on current regimen. Continue Wellbutrin. Follow up with psychiatry.

## 2024-10-31 ENCOUNTER — Other Ambulatory Visit

## 2025-05-05 ENCOUNTER — Other Ambulatory Visit

## 2025-05-06 ENCOUNTER — Ambulatory Visit: Admitting: Urology
